# Patient Record
Sex: Male | Born: 1965 | Race: White | Hispanic: No | Marital: Married | Smoking: Never smoker
Health system: Southern US, Community
[De-identification: ages and names within clinical notes are randomized; demographics above are authoritative.]

## PROBLEM LIST (undated history)

## (undated) DIAGNOSIS — C4490 Unspecified malignant neoplasm of skin, unspecified: Secondary | ICD-10-CM

## (undated) DIAGNOSIS — G4730 Sleep apnea, unspecified: Secondary | ICD-10-CM

## (undated) DIAGNOSIS — E119 Type 2 diabetes mellitus without complications: Secondary | ICD-10-CM

## (undated) HISTORY — PX: NASAL SINUS SURGERY: SHX719

---

## 2012-11-06 ENCOUNTER — Emergency Department: Payer: Self-pay | Admitting: Emergency Medicine

## 2012-11-06 LAB — BASIC METABOLIC PANEL
Anion Gap: 5 — ABNORMAL LOW (ref 7–16)
Calcium, Total: 8.9 mg/dL (ref 8.5–10.1)
Chloride: 107 mmol/L (ref 98–107)
Co2: 27 mmol/L (ref 21–32)
EGFR (African American): >60
EGFR (Non-African Amer.): >60
Osmolality: 279 (ref 275–301)
Potassium: 4.1 mmol/L (ref 3.5–5.1)

## 2012-11-06 LAB — CBC
HCT: 47.2 % (ref 40.0–52.0)
HGB: 16.3 g/dL (ref 13.0–18.0)
MCH: 30.5 pg (ref 26.0–34.0)
MCHC: 34.5 g/dL (ref 32.0–36.0)
MCV: 89 fL (ref 80–100)
RDW: 13.9 % (ref 11.5–14.5)
WBC: 8.9 10*3/uL (ref 3.8–10.6)

## 2012-11-06 LAB — TROPONIN I
Troponin-I: <0.02 ng/mL
Troponin-I: <0.02 ng/mL

## 2012-11-25 ENCOUNTER — Emergency Department: Payer: Self-pay | Admitting: Emergency Medicine

## 2012-11-25 LAB — CK TOTAL AND CKMB (NOT AT ARMC)
CK, Total: 240 U/L — ABNORMAL HIGH (ref 35–232)
CK-MB: 2.8 ng/mL (ref 0.5–3.6)

## 2012-11-25 LAB — CBC
HCT: 47 % (ref 40.0–52.0)
MCH: 30.4 pg (ref 26.0–34.0)
MCHC: 34.4 g/dL (ref 32.0–36.0)
MCV: 88 fL (ref 80–100)
Platelet: 193 10*3/uL (ref 150–440)
RBC: 5.31 10*6/uL (ref 4.40–5.90)
RDW: 13.8 % (ref 11.5–14.5)
WBC: 13.1 10*3/uL — ABNORMAL HIGH (ref 3.8–10.6)

## 2012-11-25 LAB — COMPREHENSIVE METABOLIC PANEL
Alkaline Phosphatase: 75 U/L (ref 50–136)
Anion Gap: 3 — ABNORMAL LOW (ref 7–16)
BUN: 15 mg/dL (ref 7–18)
Bilirubin,Total: 0.5 mg/dL (ref 0.2–1.0)
Calcium, Total: 8.3 mg/dL — ABNORMAL LOW (ref 8.5–10.1)
EGFR (African American): >60
Osmolality: 272 (ref 275–301)
Potassium: 4.2 mmol/L (ref 3.5–5.1)
SGOT(AST): 32 U/L (ref 15–37)
Sodium: 135 mmol/L — ABNORMAL LOW (ref 136–145)

## 2012-11-25 LAB — TROPONIN I
Troponin-I: <0.02 ng/mL
Troponin-I: <0.02 ng/mL

## 2015-04-20 ENCOUNTER — Ambulatory Visit
Admission: EM | Admit: 2015-04-20 | Discharge: 2015-04-20 | Disposition: A | Discharge status: Home / Self Care | Payer: Self-pay | Attending: Emergency Medicine | Admitting: Emergency Medicine

## 2015-04-20 ENCOUNTER — Encounter: Payer: Self-pay | Admitting: Emergency Medicine

## 2015-04-20 DIAGNOSIS — H6123 Impacted cerumen, bilateral: Secondary | ICD-10-CM

## 2015-04-20 DIAGNOSIS — H6092 Unspecified otitis externa, left ear: Secondary | ICD-10-CM

## 2015-04-20 HISTORY — DX: Type 2 diabetes mellitus without complications: E11.9

## 2015-04-20 HISTORY — DX: Sleep apnea, unspecified: G47.30

## 2015-04-20 MED ORDER — CIPROFLOXACIN-DEXAMETHASONE 0.3-0.1 % OT SUSP: 4.0000 [drp] | Freq: Two times a day (BID) | OTIC | Status: AC

## 2015-04-20 NOTE — ED Notes (Signed)
Patient c/o pain in his left ear for 3 weeks.  Patient reports some drainage from his left ear.

## 2015-04-20 NOTE — Discharge Instructions (Signed)
Use medication as prescribed. Rest. Drink plenty of fluids. Keep ear dry.  Follow up with your primary care physician this week. Follow up with Ear, Nose and throat as needed.   Return to Urgent care for new or worsening concerns.    Otitis Externa Otitis externa is a bacterial or fungal infection of the outer ear canal. This is the area from the eardrum to the outside of the ear. Otitis externa is sometimes called "swimmer's ear." CAUSES  Possible causes of infection include:  Swimming in dirty water.  Moisture remaining in the ear after swimming or bathing.  Mild injury (trauma) to the ear.  Objects stuck in the ear (foreign body).  Cuts or scrapes (abrasions) on the outside of the ear. SIGNS AND SYMPTOMS  The first symptom of infection is often itching in the ear canal. Later signs and symptoms may include swelling and redness of the ear canal, ear pain, and yellowish-white fluid (pus) coming from the ear. The ear pain may be worse when pulling on the earlobe. DIAGNOSIS  Your health care provider will perform a physical exam. A sample of fluid may be taken from the ear and examined for bacteria or fungi. TREATMENT  Antibiotic ear drops are often given for 10 to 14 days. Treatment may also include pain medicine or corticosteroids to reduce itching and swelling. HOME CARE INSTRUCTIONS   Apply antibiotic ear drops to the ear canal as prescribed by your health care provider.  Take medicines only as directed by your health care provider.  If you have diabetes, follow any additional treatment instructions from your health care provider.  Keep all follow-up visits as directed by your health care provider. PREVENTION   Keep your ear dry. Use the corner of a towel to absorb water out of the ear canal after swimming or bathing.  Avoid scratching or putting objects inside your ear. This can damage the ear canal or remove the protective wax that lines the canal. This makes it easier  for bacteria and fungi to grow.  Avoid swimming in lakes, polluted water, or poorly chlorinated pools.  You may use ear drops made of rubbing alcohol and vinegar after swimming. Combine equal parts of white vinegar and alcohol in a bottle. Put 3 or 4 drops into each ear after swimming. SEEK MEDICAL CARE IF:   You have a fever.  Your ear is still red, swollen, painful, or draining pus after 3 days.  Your redness, swelling, or pain gets worse.  You have a severe headache.  You have redness, swelling, pain, or tenderness in the area behind your ear. MAKE SURE YOU:   Understand these instructions.  Will watch your condition.  Will get help right away if you are not doing well or get worse.   This information is not intended to replace advice given to you by your health care provider. Make sure you discuss any questions you have with your health care provider.   Document Released: 01/14/2005 Document Revised: 02/04/2014 Document Reviewed: 01/31/2011 Elsevier Interactive Patient Education 2016 [employer] The structures of the external ear canal secrete a waxy substance known as cerumen. Excess cerumen can build up in the ear canal, causing a condition known as cerumen impaction. Cerumen impaction can cause ear pain and disrupt the function of the ear. The rate of cerumen production differs for each individual. In certain individuals, the configuration of the ear canal may decrease his or her ability to naturally remove cerumen. CAUSES Cerumen  impaction is caused by excessive cerumen production or buildup. RISK FACTORS  Frequent use of swabs to clean ears.  Having narrow ear canals.  Having eczema.  Being dehydrated. SIGNS AND SYMPTOMS  Diminished hearing.  Ear drainage.  Ear pain.  Ear itch. TREATMENT Treatment may involve:  Over-the-counter or prescription ear drops to soften the cerumen.  Removal of cerumen by a health care provider. This  may be done with:  Irrigation with warm water. This is the most common method of removal.  Ear curettes and other instruments.  Surgery. This may be done in severe cases. HOME CARE INSTRUCTIONS  Take medicines only as directed by your health care provider.  Do not insert objects into the ear with the intent of cleaning the ear. PREVENTION  Do not insert objects into the ear, even with the intent of cleaning the ear. Removing cerumen as a part of normal hygiene is not necessary, and the use of swabs in the ear canal is not recommended.  Drink enough water to keep your urine clear or pale yellow.  Control your eczema if you have it. SEEK MEDICAL CARE IF:  You develop ear pain.  You develop bleeding from the ear.  The cerumen does not clear after you use ear drops as directed.   This information is not intended to replace advice given to you by your health care provider. Make sure you discuss any questions you have with your health care provider.   Document Released: 02/22/2004 Document Revised: 02/04/2014 Document Reviewed: 08/31/2014 Elsevier Interactive Patient Education [employer].

## 2015-04-20 NOTE — ED Provider Notes (Signed)
[facility]  ____________________________________________  Time seen: Approximately 12:16 PM  I have reviewed the triage vital signs and the nursing notes.   HISTORY  Chief Complaint Otalgia   HPI Alvin Ward is a 50 y.o. male presents for complaints of 3 weeks of left ear fullness, states x 2 weeks with some occasional left ear pain. Patient reports that he does have a history of bilateral cerumen impaction and states that this does feel similar. Patient reports that he has been trying over-the-counter wax removal kits and has received some results state that he has gotten some of the wax out. Denies fevers. States left ear discomfort has been mild and not persistent.  Denies hearing loss, ringing in ears, swelling around ear. Denies recent sickness, runny nose, nasal congestion, sore throat, fevers, neck pain. Denies chest pain, shortness of breath, chest pain with deep breath, dizziness, weakness, hearing changes, abdominal pain, dysuria, neck pain, back pain, extremity pain or extremity swelling.  Denies recent sickness.   Past Medical History  Diagnosis Date  . Diabetes mellitus without complication ([place])   . Sleep apnea     There are no active problems to display for this patient.   Past Surgical History  Procedure Laterality Date  . Nasal sinus surgery      Current Outpatient Rx  Name  Route  Sig  Dispense  Refill  . metFORMIN (GLUCOPHAGE) 500 MG tablet   Oral   Take 500 mg by mouth 2 (two) times daily with a meal.           Allergies Ace inhibitors; Banana; and Latex  History reviewed. No pertinent family history.  Social History Social History  Substance Use Topics  . Smoking status: Never Smoker   . Smokeless tobacco: None  . Alcohol Use: No    Review of Systems Constitutional: No fever/chills Eyes: No visual changes. ENT: No sore throat.Left ear discomfort. Cardiovascular: Denies chest pain. Respiratory: Denies shortness of  breath. Gastrointestinal: No abdominal pain.  No nausea, no vomiting.  No diarrhea.  No constipation. Genitourinary: Negative for dysuria. Musculoskeletal: Negative for back pain. Skin: Negative for rash. Neurological: Negative for headaches, focal weakness or numbness.  10-point ROS otherwise negative.  ____________________________________________   PHYSICAL EXAM:  VITAL SIGNS: ED Triage Vitals  Enc Vitals Group     BP 04/20/15 1127 134/93 mmHg     Pulse Rate 04/20/15 1127 77     Resp 04/20/15 1127 16     Temp 04/20/15 1127 98.1 F (36.7 C)     Temp Source 04/20/15 1127 Oral     SpO2 04/20/15 1127 97 %     Weight 04/20/15 1127 320 lb (145.151 kg)     Height 04/20/15 1127 5\' 11"  (1.803 m)     Head Cir --      Peak Flow --      Pain Score 04/20/15 1131 5     Pain Loc --      Pain Edu? --      Excl. in [place]? --     Constitutional: Alert and oriented. Well appearing and in no acute distress. Eyes: Conjunctivae are normal. PERRL. EOMI. Head: Atraumatic. No sinus tenderness to palpation. No swelling. No erythema.  Ears: Right: Mild cerumen impaction noted, cerumen removed with curette, post cerumen impaction reexamined, normal TM, no exudate or drainage, nontender. Left: Mild whitish brownish exudate in the ear canal, mild canal swelling and inflammation, unable to initially visualize TM. Curette gently used to remove some  exudate in canal, patient tolerated well, post removal reexamined, nontender with movement, TM non-erythematous,TM appears intact. No surrounding erythema, edema or rash bilaterally. Hearing grossly intact bilaterally.  Nose: No congestion/rhinnorhea.  Mouth/Throat: Mucous membranes are moist.  Oropharynx non-erythematous. No tonsillar swelling or exudate. Neck: No stridor.  No cervical spine tenderness to palpation. Hematological/Lymphatic/Immunilogical: No cervical lymphadenopathy. Cardiovascular: Normal rate, regular rhythm. Grossly normal heart sounds.  Good  peripheral circulation. Respiratory: Normal respiratory effort.  No retractions. Lungs CTAB. Gastrointestinal: Soft and nontender. Obese abdomen. Musculoskeletal: No lower or upper extremity tenderness nor edema. No cervical, thoracic or lumbar tenderness to palpation. Neurologic:  Normal speech and language. No gross focal neurologic deficits are appreciated. No gait instability. Skin:  Skin is warm, dry and intact. No rash noted. Psychiatric: Mood and affect are normal. Speech and behavior are normal.  ____________________________________________   LABS (all labs ordered are listed, but only abnormal results are displayed)  Labs Reviewed - No data to display ____________________________________________  PROCEDURES  Procedure(s) performed:  Procedure explained and verbal consent obtained. Ceruminosis is noted.  Wax is removed by curette with manual debridement. Patient tolerated well.  ____________________________________________   INITIAL IMPRESSION / ASSESSMENT AND PLAN / ED COURSE  Pertinent labs & imaging results that were available during my care of the patient were reviewed by me and considered in my medical decision making (see chart for details).  Very well-appearing patient. No acute distress. Presents for the complaints of left ear discomfort intermittently over the last 2-3 weeks. Patient does reports history of cerumen impaction in the past and which felt similar. Denies any hearing deficits. Lungs clear throughout. Abdomen soft and nontender. Moist mucous membranes. Denies recent sickness. Right cerumen impaction removed by curette, patient reports improved hearing post removal. Patient also with cerumen and exudate and left ear canal removed with curette, patient also reports improved hearing post removal from left ear as well as improved discomfort. Patient reports feeling better. Will treat left otitis externa with Ciprodex. Encouraged keeping dry and closing monitoring  symptoms. Encouraged patient to follow-up with PCP this week or ENT as needed.  Discussed follow up with Primary care physician this week. Discussed follow up and return parameters including no resolution or any worsening concerns. Patient verbalized understanding and agreed to plan.   ____________________________________________   FINAL CLINICAL IMPRESSION(S) / ED DIAGNOSES  Final diagnoses:  Cerumen impaction, bilateral  Otitis externa, left      Note: This dictation was prepared with Dragon dictation along with smaller phrase technology. Any transcriptional errors that result from this process are unintentional.    Marylene Land, NP 04/20/15 1325

## 2016-10-09 ENCOUNTER — Encounter: Payer: Self-pay | Admitting: Emergency Medicine

## 2016-10-09 ENCOUNTER — Emergency Department: Payer: Self-pay

## 2016-10-09 ENCOUNTER — Other Ambulatory Visit: Payer: Self-pay

## 2016-10-09 ENCOUNTER — Emergency Department
Admission: EM | Admit: 2016-10-09 | Discharge: 2016-10-09 | Disposition: A | Discharge status: Home / Self Care | Payer: Self-pay | Attending: Emergency Medicine | Admitting: Emergency Medicine

## 2016-10-09 DIAGNOSIS — K219 Gastro-esophageal reflux disease without esophagitis: Secondary | ICD-10-CM | POA: Insufficient documentation

## 2016-10-09 DIAGNOSIS — E119 Type 2 diabetes mellitus without complications: Secondary | ICD-10-CM | POA: Insufficient documentation

## 2016-10-09 DIAGNOSIS — Z7984 Long term (current) use of oral hypoglycemic drugs: Secondary | ICD-10-CM | POA: Insufficient documentation

## 2016-10-09 DIAGNOSIS — Z91040 Latex allergy status: Secondary | ICD-10-CM | POA: Insufficient documentation

## 2016-10-09 LAB — CBC
HEMATOCRIT: 47.1 % (ref 40.0–52.0)
HEMOGLOBIN: 16.4 g/dL (ref 13.0–18.0)
MCH: 29.9 pg (ref 26.0–34.0)
MCHC: 34.7 g/dL (ref 32.0–36.0)
MCV: 86 fL (ref 80.0–100.0)
Platelets: 221 10*3/uL (ref 150–440)
RBC: 5.48 MIL/uL (ref 4.40–5.90)
RDW: 13.9 % (ref 11.5–14.5)
WBC: 9.2 10*3/uL (ref 3.8–10.6)

## 2016-10-09 LAB — TROPONIN I: Troponin I: <0.03 ng/mL (ref <0.03)

## 2016-10-09 LAB — BASIC METABOLIC PANEL
ANION GAP: 7 (ref 5–15)
BUN: 15 mg/dL (ref 6–20)
CHLORIDE: 100 mmol/L — AB (ref 101–111)
CO2: 28 mmol/L (ref 22–32)
Calcium: 8.5 mg/dL — ABNORMAL LOW (ref 8.9–10.3)
Creatinine, Ser: 1.09 mg/dL (ref 0.61–1.24)
GFR calc Af Amer: >60 mL/min (ref >60)
GLUCOSE: 224 mg/dL — AB (ref 65–99)
POTASSIUM: 4.3 mmol/L (ref 3.5–5.1)
SODIUM: 135 mmol/L (ref 135–145)

## 2016-10-09 MED ORDER — GI COCKTAIL ~~LOC~~
30.0000 mL | Freq: Once | ORAL | Status: AC
  Administered 2016-10-09: 30 mL via ORAL
  Filled 2016-10-09: qty 30

## 2016-10-09 MED ORDER — FAMOTIDINE 20 MG PO TABS
40.0000 mg | ORAL_TABLET | Freq: Once | ORAL | Status: AC
  Administered 2016-10-09: 40 mg via ORAL
  Filled 2016-10-09: qty 2

## 2016-10-09 MED ORDER — FAMOTIDINE 20 MG PO TABS: 20.0000 mg | ORAL_TABLET | Freq: Two times a day (BID) | ORAL | 0 refills | Status: AC

## 2016-10-09 NOTE — ED Triage Notes (Signed)
Pt c/o sudden onset of left sided chest pain that radiates into left arm causing numbness and tingling to fingertips. Pt reports pain woke him up this AM and has subsided. Pt denies N/V and SOB.

## 2016-10-09 NOTE — Discharge Instructions (Signed)
Fortunately today your blood work, EKG, and your chest x-ray were all reassuring. I very highly suspect that you're having acid reflux. In addition to initiating Pepcid twice a day for the next month, it is critically important that you make sure you avoid fried foods, avoid spicy foods, and sit up or stand up for at least 2 hours after your last meal of the day. You also need to begin losing weight as this will dramatically help your symptoms.  It was a pleasure to take care of you today, and thank you for coming to our emergency department.  If you have any questions or concerns before leaving please ask the nurse to grab me and I'm more than happy to go through your aftercare instructions again.  If you were prescribed any opioid pain medication today such as Norco, Vicodin, Percocet, morphine, hydrocodone, or oxycodone please make sure you do not drive when you are taking this medication as it can alter your ability to drive safely.  If you have any concerns once you are home that you are not improving or are in fact getting worse before you can make it to your follow-up appointment, please do not hesitate to call 911 and come back for further evaluation.  Darel Hong, MD  Results for orders placed or performed during the hospital encounter of 16/10/96  Basic metabolic panel  Result Value Ref Range   Sodium 135 135 - 145 mmol/L   Potassium 4.3 3.5 - 5.1 mmol/L   Chloride 100 (L) 101 - 111 mmol/L   CO2 28 22 - 32 mmol/L   Glucose, Bld 224 (H) 65 - 99 mg/dL   BUN 15 6 - 20 mg/dL   Creatinine, Ser 1.09 0.61 - 1.24 mg/dL   Calcium 8.5 (L) 8.9 - 10.3 mg/dL   GFR calc non Af Amer >60 >60 mL/min   GFR calc Af Amer >60 >60 mL/min   Anion gap 7 5 - 15  CBC  Result Value Ref Range   WBC 9.2 3.8 - 10.6 K/uL   RBC 5.48 4.40 - 5.90 MIL/uL   Hemoglobin 16.4 13.0 - 18.0 g/dL   HCT 47.1 40.0 - 52.0 %   MCV 86.0 80.0 - 100.0 fL   MCH 29.9 26.0 - 34.0 pg   MCHC 34.7 32.0 - 36.0 g/dL   RDW 13.9  11.5 - 14.5 %   Platelets 221 150 - 440 K/uL  Troponin I  Result Value Ref Range   Troponin I <0.03 <0.03 ng/mL   Dg Chest 2 View  Result Date: 10/09/2016 CLINICAL DATA:  Sudden onset of left-sided chest pain. EXAM: CHEST  2 VIEW COMPARISON:  Radiograph 11/25/2012 FINDINGS: The cardiomediastinal contours are normal. The lungs are clear. Pulmonary vasculature is normal. No consolidation, pleural effusion, or pneumothorax. No acute osseous abnormalities are seen. IMPRESSION: No acute abnormality. Electronically Signed   By: Jeb Levering M.D.   On: 10/09/2016 05:02

## 2016-10-09 NOTE — ED Provider Notes (Signed)
[facility] Emergency Department Provider Note  ____________________________________________   First MD Initiated Contact with Patient 10/09/16 [phone]     (approximate)  I have reviewed the triage vital signs and the nursing notes.   HISTORY   Chief Complaint Chest Pain    HPI Alvin Ward is a 51 y.o. male who self presents to the emergency department with several hours of lower chest/upper abdominal burning discomfort. The pain woke him from his sleep roughly around 2:30 in the morning. It is associated with a dry cough. No foul taste in his mouth. It is nonexertional. It is constant. It is associated with nausea but no vomiting. He has a past medical history of diabetes and morbid obesity. He's had no history of deep vein thrombosis or pulmonary embolism. He's had no leg swelling.Last night he had a cheeseburger shortly before going to bed. He does not drink alcohol. The patient actually initially awoke from sleep with numbness and tingling in his left arm although he says that he frequently has these symptoms on both his right and his left arm when lying on the sides.   Past Medical History:  Diagnosis Date  . Diabetes mellitus without complication ([place])   . Sleep apnea     There are no active problems to display for this patient.   Past Surgical History:  Procedure Laterality Date  . NASAL SINUS SURGERY      Prior to Admission medications   Medication Sig Start Date End Date Taking? Authorizing Provider  famotidine (PEPCID) 20 MG tablet Take 1 tablet (20 mg total) by mouth 2 (two) times daily. 10/09/16 10/09/17  Darel Hong, MD  metFORMIN (GLUCOPHAGE) 500 MG tablet Take 500 mg by mouth 2 (two) times daily with a meal.    [provider]    Allergies Ace inhibitors; Banana; and Latex  History reviewed. No pertinent family history.  Social History Social History  Substance Use Topics  . Smoking status: Never Smoker  .  Smokeless tobacco: Never Used  . Alcohol use No    Review of Systems Constitutional: No fever/chills Eyes: No visual changes. ENT: No sore throat. Cardiovascular: Positive chest pain. Respiratory: Denies shortness of breath. Gastrointestinal: Positive abdominal pain.  Positive nausea, no vomiting.  No diarrhea.  No constipation. Genitourinary: Negative for dysuria. Musculoskeletal: Negative for back pain. Skin: Negative for rash. Neurological: Negative for headaches, focal weakness or numbness.   ____________________________________________   PHYSICAL EXAM:  VITAL SIGNS: ED Triage Vitals  Enc Vitals Group     BP 10/09/16 0443 (!) 149/86     Pulse Rate 10/09/16 0443 84     Resp 10/09/16 0443 17     Temp 10/09/16 0443 97.6 F (36.4 C)     Temp Source 10/09/16 0443 Oral     SpO2 10/09/16 0443 96 %     Weight 10/09/16 0440 (!) 320 lb (145.2 kg)     Height --      Head Circumference --      Peak Flow --      Pain Score --      Pain Loc --      Pain Edu? --      Excl. in [place]? --     Constitutional: Alert and oriented 4 well appearing nontoxic no diaphoresis speaks in full clear sentences Eyes: PERRL EOMI. Head: Atraumatic. Nose: No congestion/rhinnorhea. Mouth/Throat: No trismus Neck: No stridor.   Cardiovascular: Normal rate, regular rhythm. Grossly normal heart sounds.  Good peripheral  circulation. Respiratory: Normal respiratory effort.  No retractions. Lungs CTAB and moving good air Gastrointestinal: Morbidly obese soft nontender Musculoskeletal: No lower extremity edema   Neurologic:  Normal speech and language. No gross focal neurologic deficits are appreciated. Skin:  Skin is warm, dry and intact. No rash noted. Psychiatric: Mood and affect are normal. Speech and behavior are normal.    ____________________________________________   DIFFERENTIAL includes but not limited to  Gastric reflux, esophagitis, pulmonary embolism, acute coronary syndrome,  pneumothorax ____________________________________________   LABS (all labs ordered are listed, but only abnormal results are displayed)  Labs Reviewed  BASIC METABOLIC PANEL - Abnormal; Notable for the following:       Result Value   Chloride 100 (*)    Glucose, Bld 224 (*)    Calcium 8.5 (*)    All other components within normal limits  CBC  TROPONIN I    No signs of acute ischemia elevated glucose although no signs of diabetic ketoacidosis __________________________________________  EKG  ED ECG REPORT I, Darel Hong, the attending physician, personally viewed and interpreted this ECG.  Date: 10/09/2016 Rate: 88 Rhythm: normal sinus rhythm QRS Axis: normal Intervals: normal ST/T Wave abnormalities: normal Narrative Interpretation: no evidence of acute ischemia  ____________________________________________  RADIOLOGY  Chest x-ray with no acute disease noted ____________________________________________   PROCEDURES  Procedure(s) performed: o  Procedures  Critical Care performed: no  Observation: no ____________________________________________   INITIAL IMPRESSION / ASSESSMENT AND PLAN / ED COURSE  Pertinent labs & imaging results that were available during my care of the patient were reviewed by me and considered in my medical decision making (see chart for details).  The patient arrives hemodynamically stable and well-appearing with a normal EKG and chest x-ray. His history is very atypical and not consistent with acute coronary syndrome. His given a GI cocktail and 40 mg of famotidine which nearly completely resolved his symptoms. I've encouraged the patient to begin losing weight and to take an H2 blocker for a full month. He is discharged home in improved condition.      ____________________________________________   FINAL CLINICAL IMPRESSION(S) / ED DIAGNOSES  Final diagnoses:  Gastric reflux  Morbid obesity ([place])      NEW MEDICATIONS  STARTED DURING THIS VISIT:  Discharge Medication List as of 10/09/2016  6:26 AM    START taking these medications   Details  famotidine (PEPCID) 20 MG tablet Take 1 tablet (20 mg total) by mouth 2 (two) times daily., Starting Wed 10/09/2016, Until Thu 10/09/2017, Print         Note:  This document was prepared using Dragon voice recognition software and may include unintentional dictation errors.     Darel Hong, MD 10/09/16 1554

## 2017-03-04 ENCOUNTER — Other Ambulatory Visit: Payer: Self-pay

## 2017-03-04 ENCOUNTER — Ambulatory Visit
Admission: EM | Admit: 2017-03-04 | Discharge: 2017-03-04 | Disposition: A | Discharge status: Home / Self Care | Payer: Self-pay | Attending: Family Medicine | Admitting: Family Medicine

## 2017-03-04 ENCOUNTER — Encounter: Payer: Self-pay | Admitting: *Deleted

## 2017-03-04 DIAGNOSIS — R059 Cough, unspecified: Secondary | ICD-10-CM

## 2017-03-04 DIAGNOSIS — R05 Cough: Secondary | ICD-10-CM

## 2017-03-04 MED ORDER — HYDROCODONE-HOMATROPINE 5-1.5 MG/5ML PO SYRP: 5.0000 mL | ORAL_SOLUTION | Freq: Four times a day (QID) | ORAL | 0 refills | Status: DC | PRN

## 2017-03-04 MED ORDER — AZITHROMYCIN 250 MG PO TABS: ORAL_TABLET | ORAL | 0 refills | Status: DC

## 2017-03-04 NOTE — Discharge Instructions (Signed)
This is likely bronchitis.   Medications as prescribed.  Take care  Dr. Lacinda Axon

## 2017-03-04 NOTE — ED Provider Notes (Signed)
[facility]    CSN: [id number] Arrival date & time: 03/04/17  7628  History   Chief Complaint Chief Complaint  Patient presents with  . Cough   HPI  52 year old male presents with cough.  States that he has not been feeling we since Saturday.  He has had chest congestion, chest tightness, cough.  He states that his cough is not particularly productive.  No fever that he knows of but he has had felt febrile and is also had chills.  He reports body aches as well.  No reported sick contacts.  No known exacerbating or relieving factors.  No reports of shortness of breath.  He states that his cough is worse at night.  No other associated symptoms.  No other complaints.  Past Medical History:  Diagnosis Date  . Diabetes mellitus without complication ([place])   . Sleep apnea    Past Surgical History:  Procedure Laterality Date  . NASAL SINUS SURGERY     Home Medications    Prior to Admission medications   Medication Sig Start Date End Date Taking? Authorizing Provider  metFORMIN (GLUCOPHAGE) 500 MG tablet Take 500 mg by mouth 2 (two) times daily with a meal.   Yes [provider]  azithromycin (ZITHROMAX) 250 MG tablet 2 tablets on day 1, then 1 tablet daily on days 2-5. 03/04/17   Coral Spikes, DO  famotidine (PEPCID) 20 MG tablet Take 1 tablet (20 mg total) by mouth 2 (two) times daily. 10/09/16 10/09/17  Darel Hong, MD  HYDROcodone-homatropine (HYCODAN) 5-1.5 MG/5ML syrup Take 5 mLs by mouth every 6 (six) hours as needed. 03/04/17   Coral Spikes, DO   Family History Family History  Problem Relation Age of Onset  . Diabetes Mother   . Diabetes Father    Social History Social History   Tobacco Use  . Smoking status: Never Smoker  . Smokeless tobacco: Never Used  Substance Use Topics  . Alcohol use: No  . Drug use: No    Allergies   Ace inhibitors; Banana; and Latex   Review of Systems Review of Systems  Constitutional: Positive for chills.    Respiratory: Positive for cough and chest tightness.   Musculoskeletal:       Body aches.   Physical Exam Triage Vital Signs ED Triage Vitals  Enc Vitals Group     BP 03/04/17 0845 (!) 144/90     Pulse Rate 03/04/17 0845 95     Resp 03/04/17 0845 16     Temp 03/04/17 0845 99.1 F (37.3 C)     Temp Source 03/04/17 0845 Oral     SpO2 03/04/17 0845 95 %     Weight 03/04/17 0846 (!) 350 lb (158.8 kg)     Height 03/04/17 0846 5\' 11"  (1.803 m)     Head Circumference --      Peak Flow --      Pain Score 03/04/17 0845 0     Pain Loc --      Pain Edu? --      Excl. in [place]? --    Updated Vital Signs BP (!) 144/90 (BP Location: Left Arm)   Pulse 95   Temp 99.1 F (37.3 C) (Oral)   Resp 16   Ht 5\' 11"  (1.803 m)   Wt (!) 350 lb (158.8 kg)   SpO2 95%   BMI 48.82 kg/m     Physical Exam  Constitutional: He is oriented to person, place, and time.  He appears well-developed and well-nourished. No distress.  HENT:  Head: Normocephalic and atraumatic.  Mouth/Throat: Oropharynx is clear and moist.  Eyes: Conjunctivae are normal. Right eye exhibits no discharge. Left eye exhibits no discharge.  Cardiovascular: Normal rate and regular rhythm.  No murmur heard. Pulmonary/Chest: Effort normal and breath sounds normal. No respiratory distress. He has no wheezes. He has no rales.  Neurological: He is alert and oriented to person, place, and time.  Psychiatric: He has a normal mood and affect. His behavior is normal.  Nursing note and vitals reviewed.  UC Treatments / Results  Labs (all labs ordered are listed, but only abnormal results are displayed) Labs Reviewed - No data to display  EKG  EKG Interpretation None       Radiology No results found.  Procedures Procedures (including critical care time)  Medications Ordered in UC Medications - No data to display   Initial Impression / Assessment and Plan / UC Course  I have reviewed the triage vital signs and the nursing  notes.  Pertinent labs & imaging results that were available during my care of the patient were reviewed by me and considered in my medical decision making (see chart for details).     52 year old male presents with cough.  Suspect the beginnings of bronchitis.  Patient has no insurance.  Exam unremarkable and unrevealing.  However, his pulse ox is 95% on room air.  This is atypical.  Given her clinical picture and all of the above, I have elected to treat him with azithromycin. Hycodan for cough.  Final Clinical Impressions(s) / UC Diagnoses   Final diagnoses:  Cough    ED Discharge Orders        Ordered    azithromycin (ZITHROMAX) 250 MG tablet     03/04/17 0932    HYDROcodone-homatropine (HYCODAN) 5-1.5 MG/5ML syrup  Every 6 hours PRN     03/04/17 0932     Controlled Substance Prescriptions [place] Controlled Substance Registry consulted? Not Applicable   Coral Spikes, DO 03/04/17 6270

## 2017-03-04 NOTE — ED Triage Notes (Signed)
Patient started having chest congestion, cough and body aches 3 days ago.

## 2017-03-06 ENCOUNTER — Ambulatory Visit
Admission: EM | Admit: 2017-03-06 | Discharge: 2017-03-06 | Disposition: A | Discharge status: Home / Self Care | Payer: Self-pay | Attending: Emergency Medicine | Admitting: Emergency Medicine

## 2017-03-06 ENCOUNTER — Encounter: Payer: Self-pay | Admitting: *Deleted

## 2017-03-06 ENCOUNTER — Ambulatory Visit (INDEPENDENT_AMBULATORY_CARE_PROVIDER_SITE_OTHER): Payer: Self-pay

## 2017-03-06 ENCOUNTER — Other Ambulatory Visit: Payer: Self-pay

## 2017-03-06 DIAGNOSIS — R05 Cough: Secondary | ICD-10-CM

## 2017-03-06 DIAGNOSIS — R059 Cough, unspecified: Secondary | ICD-10-CM | POA: Diagnosis present

## 2017-03-06 NOTE — Discharge Instructions (Addendum)
Cxr was normal. Please finish abx as directed, drink plenty of water. Follow up with PCP next week if symptoms persist. If worsening symptoms (shortness of breath, chest pain, etc) go to ER. Return to UC as needed.

## 2017-03-06 NOTE — ED Provider Notes (Addendum)
[facility]    CSN: [id number] Arrival date & time: 03/06/17  0849     History   Chief Complaint Chief Complaint  Patient presents with  . Cough  . Nasal Congestion    HPI Alvin Ward is a 52 y.o. male.   52 yr old morbidly obese male pt presents to UC with cc of continued cough seen 2/5 here for same, taking zpak and hycodan with little relief. Pt concerned not well, here for re-evaluation. Hx of sleep apnea.   The history is provided by the patient and the spouse. No language interpreter was used.  Cough  Cough characteristics:  Non-productive Sputum characteristics:  Nondescript Severity:  Moderate Onset quality:  Gradual Timing:  Constant Progression:  Unchanged Chronicity:  New Smoker: no   Context: upper respiratory infection   Relieved by:  Nothing Worsened by:  Lying down Ineffective treatments:  Rest (hycodan) Associated symptoms: no chills, no fever, no headaches, no rash, no shortness of breath and no sore throat   Associated symptoms comment:  Cough Risk factors: recent infection     Past Medical History:  Diagnosis Date  . Diabetes mellitus without complication ([place])   . Sleep apnea     Patient Active Problem List   Diagnosis Date Noted  . Cough 03/06/2017    Past Surgical History:  Procedure Laterality Date  . NASAL SINUS SURGERY         Home Medications    Prior to Admission medications   Medication Sig Start Date End Date Taking? Authorizing Provider  azithromycin (ZITHROMAX) 250 MG tablet 2 tablets on day 1, then 1 tablet daily on days 2-5. 03/04/17  Yes Cook, Jayce G, DO  famotidine (PEPCID) 20 MG tablet Take 1 tablet (20 mg total) by mouth 2 (two) times daily. 10/09/16 10/09/17 Yes Darel Hong, MD  HYDROcodone-homatropine [facility]) 5-1.5 MG/5ML syrup Take 5 mLs by mouth every 6 (six) hours as needed. 03/04/17  Yes Cook, Jayce G, DO  metFORMIN (GLUCOPHAGE) 500 MG tablet Take 500 mg by mouth 2 (two) times daily with a  meal.   Yes [provider]    Family History Family History  Problem Relation Age of Onset  . Diabetes Mother   . Diabetes Father     Social History Social History   Tobacco Use  . Smoking status: Never Smoker  . Smokeless tobacco: Never Used  Substance Use Topics  . Alcohol use: No  . Drug use: No     Allergies   Ace inhibitors; Banana; and Latex   Review of Systems Review of Systems  Constitutional: Negative for chills and fever.  HENT: Positive for congestion and postnasal drip. Negative for sore throat.   Eyes: Negative.   Respiratory: Positive for cough. Negative for shortness of breath.   Gastrointestinal: Negative for abdominal pain, nausea and vomiting.  Endocrine: Negative.   Genitourinary: Negative for dysuria.  Musculoskeletal: Negative for back pain.  Skin: Negative for rash.  Allergic/Immunologic: Negative.   Neurological: Negative for headaches.  Hematological: Negative.   Psychiatric/Behavioral: Negative.   All other systems reviewed and are negative.    Physical Exam Triage Vital Signs ED Triage Vitals  Enc Vitals Group     BP 03/06/17 0932 (!) 148/91     Pulse Rate 03/06/17 0932 86     Resp 03/06/17 0932 16     Temp 03/06/17 0932 98.8 F (37.1 C)     Temp Source 03/06/17 0932 Oral     SpO2  03/06/17 0932 97 %     Weight --      Height --      Head Circumference --      Peak Flow --      Pain Score 03/06/17 0934 3     Pain Loc --      Pain Edu? --      Excl. in [place]? --    No data found.  Updated Vital Signs BP (!) 148/91 (BP Location: Left Arm)   Pulse 86   Temp 98.8 F (37.1 C) (Oral)   Resp 16   SpO2 97%   Visual Acuity Right Eye Distance:   Left Eye Distance:   Bilateral Distance:    Right Eye Near:   Left Eye Near:    Bilateral Near:     Physical Exam  Constitutional: He is oriented to person, place, and time. He appears well-developed and well-nourished. He is active and cooperative.  Non-toxic  appearance. He does not have a sickly appearance. He does not appear ill. No distress.  HENT:  Head: Normocephalic.  Right Ear: Tympanic membrane is retracted.  Left Ear: Tympanic membrane is retracted.  Nose: Mucosal edema present.  Mouth/Throat: Uvula is midline, oropharynx is clear and moist and mucous membranes are normal.  Eyes: Conjunctivae, EOM and lids are normal. Pupils are equal, round, and reactive to light.  Neck: Normal range of motion.  Cardiovascular: Normal rate, regular rhythm and normal heart sounds.  Pulmonary/Chest: Effort normal and breath sounds normal. No respiratory distress. He has no decreased breath sounds. He has no wheezes.  Abdominal: Soft. He exhibits no distension.  Musculoskeletal: Normal range of motion.  Neurological: He is alert and oriented to person, place, and time. No cranial nerve deficit or sensory deficit. GCS eye subscore is 4. GCS verbal subscore is 5. GCS motor subscore is 6.  Skin: Skin is warm, dry and intact. No rash noted.  Psychiatric: He has a normal mood and affect. His speech is normal and behavior is normal. Thought content normal.  Nursing note and vitals reviewed.    UC Treatments / Results  Labs (all labs ordered are listed, but only abnormal results are displayed) Labs Reviewed - No data to display  EKG  EKG Interpretation None       Radiology Dg Chest 2 View  Result Date: 03/06/2017 CLINICAL DATA:  Productive cough and chest tightness for the past 5 days. EXAM: CHEST  2 VIEW COMPARISON:  Chest x-ray dated October 09, 2016. FINDINGS: The heart size and mediastinal contours are within normal limits. Both lungs are clear. The visualized skeletal structures are unremarkable. IMPRESSION: No active cardiopulmonary disease. Electronically Signed   By: Titus Dubin M.D.   On: 03/06/2017 10:57    Procedures Procedures (including critical care time)  Medications Ordered in UC Medications - No data to display   Initial  Impression / Assessment and Plan / UC Course  I have reviewed the triage vital signs and the nursing notes.  Pertinent labs & imaging results that were available during my care of the patient were reviewed by me and considered in my medical decision making (see chart for details).  Discussed pt exam and plan of care with Dr. Lacinda Axon, agrees with plan of care. CXR obtained to r/o CHF or pneumonia. Pt agrees to CXR. Reviewed results and plan of care with Dr Lacinda Axon and pt.   Cxr was normal. Please finish abx as directed, drink plenty of water. Follow up with PCP  next week if symptoms persist. If worsening symptoms (shortness of breath, chest pain, etc) go to ER. Return to UC as needed. Pt verbalized understanding to this provider.   Final Clinical Impressions(s) / UC Diagnoses   Final diagnoses:  Cough    ED Discharge Orders    None       Controlled Substance Prescriptions    Jerris Keltz, Jeanett Schlein, NP 51/89/84 2103    Tori Milks, NP 12/81/18 2129

## 2017-03-06 NOTE — ED Triage Notes (Signed)
Patient's symptoms of cough and congestion have not improved since last visit on 03/04/17.

## 2018-01-10 ENCOUNTER — Other Ambulatory Visit: Payer: Self-pay

## 2018-01-10 ENCOUNTER — Emergency Department
Admission: EM | Admit: 2018-01-10 | Discharge: 2018-01-10 | Disposition: A | Discharge status: Home / Self Care | Payer: Self-pay | Attending: Emergency Medicine | Admitting: Emergency Medicine

## 2018-01-10 DIAGNOSIS — Z85828 Personal history of other malignant neoplasm of skin: Secondary | ICD-10-CM | POA: Insufficient documentation

## 2018-01-10 DIAGNOSIS — K645 Perianal venous thrombosis: Secondary | ICD-10-CM | POA: Insufficient documentation

## 2018-01-10 DIAGNOSIS — K649 Unspecified hemorrhoids: Secondary | ICD-10-CM | POA: Insufficient documentation

## 2018-01-10 DIAGNOSIS — Z91040 Latex allergy status: Secondary | ICD-10-CM | POA: Insufficient documentation

## 2018-01-10 DIAGNOSIS — E119 Type 2 diabetes mellitus without complications: Secondary | ICD-10-CM | POA: Insufficient documentation

## 2018-01-10 DIAGNOSIS — Z7984 Long term (current) use of oral hypoglycemic drugs: Secondary | ICD-10-CM | POA: Insufficient documentation

## 2018-01-10 HISTORY — DX: Unspecified malignant neoplasm of skin, unspecified: C44.90

## 2018-01-10 LAB — PROTIME-INR
INR: 1.09
Prothrombin Time: 14 seconds (ref 11.4–15.2)

## 2018-01-10 LAB — COMPREHENSIVE METABOLIC PANEL
ALT: 21 U/L (ref 0–44)
AST: 18 U/L (ref 15–41)
Albumin: 3.8 g/dL (ref 3.5–5.0)
Alkaline Phosphatase: 61 U/L (ref 38–126)
Anion gap: 5 (ref 5–15)
BUN: 18 mg/dL (ref 6–20)
CO2: 25 mmol/L (ref 22–32)
CREATININE: 1.32 mg/dL — AB (ref 0.61–1.24)
Calcium: 8.4 mg/dL — ABNORMAL LOW (ref 8.9–10.3)
Chloride: 103 mmol/L (ref 98–111)
GFR calc Af Amer: >60 mL/min (ref >60)
Glucose, Bld: 219 mg/dL — ABNORMAL HIGH (ref 70–99)
Potassium: 3.7 mmol/L (ref 3.5–5.1)
Sodium: 133 mmol/L — ABNORMAL LOW (ref 135–145)
Total Bilirubin: 1 mg/dL (ref 0.3–1.2)
Total Protein: 7.1 g/dL (ref 6.5–8.1)

## 2018-01-10 LAB — CBC
HCT: 47.4 % (ref 39.0–52.0)
Hemoglobin: 16.2 g/dL (ref 13.0–17.0)
MCH: 29.9 pg (ref 26.0–34.0)
MCHC: 34.2 g/dL (ref 30.0–36.0)
MCV: 87.5 fL (ref 80.0–100.0)
NRBC: 0 % (ref 0.0–0.2)
Platelets: 211 10*3/uL (ref 150–400)
RBC: 5.42 MIL/uL (ref 4.22–5.81)
RDW: 13.3 % (ref 11.5–15.5)
WBC: 10.9 10*3/uL — ABNORMAL HIGH (ref 4.0–10.5)

## 2018-01-10 LAB — LIPASE, BLOOD: LIPASE: 32 U/L (ref 11–51)

## 2018-01-10 MED ORDER — WITCH HAZEL-GLYCERIN EX PADS: 1.0000 "application " | MEDICATED_PAD | CUTANEOUS | 0 refills | Status: AC | PRN

## 2018-01-10 MED ORDER — HYDROCORTISONE 2.5 % RE CREA
TOPICAL_CREAM | Freq: Once | RECTAL | Status: AC
  Administered 2018-01-10: 12:00:00 via RECTAL
  Filled 2018-01-10: qty 28.35

## 2018-01-10 MED ORDER — LIDO-PE-GLYCERIN-PETROLATUM 5-0.25-14.4-15 % RE CREA: 1.0000 "application " | TOPICAL_CREAM | Freq: Three times a day (TID) | RECTAL | 0 refills | Status: DC | PRN

## 2018-01-10 MED ORDER — HYDROCORTISONE 2.5 % RE CREA: TOPICAL_CREAM | RECTAL | 0 refills | Status: AC

## 2018-01-10 MED ORDER — LIDO-PE-GLYCERIN-PETROLATUM 5-0.25-14.4-15 % RE CREA: 1.0000 "application " | TOPICAL_CREAM | Freq: Three times a day (TID) | RECTAL | 0 refills | Status: AC | PRN

## 2018-01-10 MED ORDER — LIDOCAINE HCL URETHRAL/MUCOSAL 2 % EX GEL
1.0000 "application " | Freq: Once | CUTANEOUS | Status: AC
  Administered 2018-01-10: 1 via TOPICAL
  Filled 2018-01-10: qty 10

## 2018-01-10 MED ORDER — WITCH HAZEL-GLYCERIN EX PADS: 1.0000 "application " | MEDICATED_PAD | CUTANEOUS | 0 refills | Status: DC | PRN

## 2018-01-10 NOTE — ED Provider Notes (Signed)
[facility] Emergency Department Provider Note  ____________________________________________  Time seen: Approximately 11:06 AM  I have reviewed the triage vital signs and the nursing notes.   HISTORY  Chief Complaint Hemorrhoids    HPI Alvin Ward is a 52 y.o. male that presents to emergency department for evaluation of bright red blood per rectum for 1 day.  Patient states that he felt constipated on Thursday and had a normal sized hard bowel movement.  He was straining last night to have a bowel movement and noticed blood in the toilet.  He had a normal bowel movement last night.  He did not notice any blood in bowel movement or dark stools.  He has continued to have bright red blood per rectum since bowel movement last night.  He states he put on 4 pairs of underwear 2 hours prior and has soaked through underwear.  He has had some overall abdominal discomfort.  No history of hemorrhoids.  He has tried hemorrhoid cream without relief.  No fever, chills, nausea, vomiting.  Past Medical History:  Diagnosis Date  . Diabetes mellitus without complication ([place])   . Skin cancer   . Sleep apnea     Patient Active Problem List   Diagnosis Date Noted  . Cough 03/06/2017    Past Surgical History:  Procedure Laterality Date  . NASAL SINUS SURGERY      Prior to Admission medications   Medication Sig Start Date End Date Taking? Authorizing Provider  azithromycin (ZITHROMAX) 250 MG tablet 2 tablets on day 1, then 1 tablet daily on days 2-5. 03/04/17   Coral Spikes, DO  famotidine (PEPCID) 20 MG tablet Take 1 tablet (20 mg total) by mouth 2 (two) times daily. 10/09/16 10/09/17  Darel Hong, MD  HYDROcodone-homatropine (HYCODAN) 5-1.5 MG/5ML syrup Take 5 mLs by mouth every 6 (six) hours as needed. 03/04/17   Coral Spikes, DO  metFORMIN (GLUCOPHAGE) 500 MG tablet Take 500 mg by mouth 2 (two) times daily with a meal.    [provider]     Allergies Ace inhibitors; Banana; and Latex  Family History  Problem Relation Age of Onset  . Diabetes Mother   . Diabetes Father     Social History Social History   Tobacco Use  . Smoking status: Never Smoker  . Smokeless tobacco: Never Used  Substance Use Topics  . Alcohol use: No  . Drug use: No     Review of Systems  Constitutional: No fever/chills Respiratory:  No SOB. Gastrointestinal: Positive for abdominal pain.  No nausea, no vomiting.  Musculoskeletal: Negative for musculoskeletal pain. Skin: Negative for rash, abrasions, lacerations, ecchymosis.   ____________________________________________   PHYSICAL EXAM:  VITAL SIGNS: ED Triage Vitals  Enc Vitals Group     BP 01/10/18 0824 135/89     Pulse Rate 01/10/18 0824 90     Resp 01/10/18 0824 20     Temp 01/10/18 0824 98 F (36.7 C)     Temp Source 01/10/18 0824 Oral     SpO2 01/10/18 0824 99 %     Weight 01/10/18 0824 (!) 350 lb (158.8 kg)     Height 01/10/18 0824 5\' 10"  (1.778 m)     Head Circumference --      Peak Flow --      Pain Score 01/10/18 0827 3     Pain Loc --      Pain Edu? --      Excl. in [place]? --  Constitutional: Alert and oriented. Well appearing and in no acute distress. Eyes: Conjunctivae are normal. PERRL. EOMI. Head: Atraumatic. ENT:      Ears:      Nose: No congestion/rhinnorhea.      Mouth/Throat: Mucous membranes are moist.  Neck: No stridor.   Cardiovascular: Normal rate, regular rhythm.  Good peripheral circulation. Respiratory: Normal respiratory effort without tachypnea or retractions. Lungs CTAB. Good air entry to the bases with no decreased or absent breath sounds. Gastrointestinal: Bowel sounds 4 quadrants. Soft and nontender to palpation. No guarding or rigidity. No palpable masses. No distention. Rectal: 2 cm by 2 cm tender non reducible hemmorroid to 3:00 position of rectum that is actively bleeding. Musculoskeletal: Full range of motion to all  extremities. No gross deformities appreciated. Neurologic:  Normal speech and language. No gross focal neurologic deficits are appreciated.  Skin:  Skin is warm, dry and intact. No rash noted. Psychiatric: Mood and affect are normal. Speech and behavior are normal. Patient exhibits appropriate insight and judgement.   ____________________________________________   LABS (all labs ordered are listed, but only abnormal results are displayed)  Labs Reviewed  CBC - Abnormal; Notable for the following components:      Result Value   WBC 10.9 (*)    All other components within normal limits  COMPREHENSIVE METABOLIC PANEL - Abnormal; Notable for the following components:   Sodium 133 (*)    Glucose, Bld 219 (*)    Creatinine, Ser 1.32 (*)    Calcium 8.4 (*)    All other components within normal limits  LIPASE, BLOOD  PROTIME-INR   ____________________________________________  EKG   ____________________________________________  RADIOLOGY  No results found.  ____________________________________________    PROCEDURES  Procedure(s) performed:    Procedures    Medications - No data to display   ____________________________________________   INITIAL IMPRESSION / ASSESSMENT AND PLAN / ED COURSE  Pertinent labs & imaging results that were available during my care of the patient were reviewed by me and considered in my medical decision making (see chart for details).  Review of the [place] CSRS was performed in accordance of the [place] prior to dispensing any controlled drugs.   Patient presents emergency department for evaluation of hemorrhoid.  Vital signs and exam are reassuring.  Patient has an actively bleeding external hemorrhoid.  Care was transferred to Dr. Reita Cliche for actively bleeding hemorrhoid.     ____________________________________________  FINAL CLINICAL IMPRESSION(S) / ED DIAGNOSES  Final diagnoses:  None      NEW MEDICATIONS STARTED DURING THIS  VISIT:  ED Discharge Orders    None          This chart was dictated using voice recognition software/Dragon. Despite best efforts to proofread, errors can occur which can change the meaning. Any change was purely unintentional.    Laban Emperor, PA-C 01/10/18 [place], [place], MD 02/05/18 [phone]

## 2018-01-10 NOTE — ED Notes (Signed)
Patient reports cream helped with rectal pain. Awaiting disposition.

## 2018-01-10 NOTE — ED Notes (Addendum)
Assumed care of patient reports history of hemorrhoids, was constipated a few days, when finally had a bowel movement began to bleed from hemorrhoids and has not stopped bleeding since. Bleeding since last night at 10pm, describes blood as bright red blood, denies clots. Reports rectal pain 2/10 at present time. Bleeding has resolved since in ED. Awaiting prescriptions as per patient plan as per patient is to discharge to home. H/H within norm as per lab results.

## 2018-01-10 NOTE — ED Provider Notes (Signed)
[facility]  I accepted care from PA Laban Emperor    Physical exam:   General: NAD.   HEENT:  Atraumatic.  Moist mucus membranes. Respiratory:  No tachypnea.  No grossly audible wheezing Abdomen:  Obese, nontender Rectal: External thrombosed hemmorhoid, mildly tender to palpation.  Dried blood, tiny amount of ooze blood. Extremities: Normal in appearance Skin: Warm and dry.  ____________________________________________    LABS (pertinent positives/negatives)  I, Lisa Roca, MD have personally reviewed the lab reports noted below.  Labs Reviewed  CBC - Abnormal; Notable for the following components:      Result Value   WBC 10.9 (*)    All other components within normal limits  COMPREHENSIVE METABOLIC PANEL - Abnormal; Notable for the following components:   Sodium 133 (*)    Glucose, Bld 219 (*)    Creatinine, Ser 1.32 (*)    Calcium 8.4 (*)    All other components within normal limits  LIPASE, BLOOD  PROTIME-INR     ____________________________________________    RADIOLOGY All xrays were viewed by me. Imaging interpreted by radiologist.  I, Lisa Roca MD have personally reviewed the imaging report noted below.  None  ____________________________________________   PROCEDURES  Procedure(s) performed: None  Procedures  Critical Care performed: None  ____________________________________________   INITIAL IMPRESSION / ASSESSMENT AND PLAN / ED COURSE   Pertinent labs & imaging results that were available during my care of the patient were reviewed by me and considered in my medical decision making (see chart for details).   PA moved patient to [facility] for evaluation due to rectal bleeding.  On my exam, there is a thrombosed hemorrhoid which is moderately tender with some mild oozing.  Patient describes to me that he started with a sensation of a hemorrhoid when he was sitting on Wednesday which is about 3 days ago now.  And  then overnight he had some bleeding initially streaking yesterday and then oozing through 3 pair of underwear that he was wearing by this morning.  He has never had a bleeding hemorrhoid before.  No abdominal pain.  No nausea or vomiting.  On exam, 4/10 pain to external hemorrhoid, some dried blood and a tiny amount of ooze.  Labs are reassuring - no anemia or drop in hemoglobin.  I do not suspect any internal bleeding or life threatening bleeding.  No systemic symptoms.  Within fairly minimal and certainly manageable pain, I do not think that excising the thrombosed external hemorrhoid is reasonable/warrented at this point.  We discussed conservative management and return precautions.    CONSULTATIONS: None    Patient / Family / Caregiver informed of clinical course, medical decision-making process, and agree with plan.   I discussed return precautions, follow-up instructions, and discharged instructions with patient and/or family.   Discharge instructions:  Your evaluated for hemorrhoid which is bleeding, and as we discussed your exam and evaluation overall reassuring.  Please get over-the-counter sitz bath tub and soak twice per day with warm water.  Take over the counter stool softener such as docusate (also called Colace) - use as directed.  Take over the counter fiber supplement such as psyllium  - use as directed on label to help with constipation.  Return to the ER for any worsening or uncontrolled pain, fever, abdominal pain, any worsening or uncontrolled bleeding or dizziness, passing out, or any other symptoms concerning to you.      ____________________________________________   FINAL CLINICAL IMPRESSION(S) / ED  DIAGNOSES  Final diagnoses:  External thrombosed hemorrhoids  Bleeding hemorrhoid        Lisa Roca, MD 01/10/18 1213

## 2018-01-10 NOTE — ED Triage Notes (Signed)
Thursday states constipation. Then began having hemorrhoid pain with bleeding.   A&O, ambulatory. No distress noted.

## 2018-01-10 NOTE — ED Notes (Signed)
Rectal cream provided.

## 2018-01-10 NOTE — ED Notes (Signed)
Rectal bleeding - see pa note

## 2018-01-10 NOTE — Discharge Instructions (Addendum)
Your evaluated for hemorrhoid which is bleeding, and as we discussed your exam and evaluation overall reassuring.  Please get over-the-counter sitz bath tub and soak twice per day with warm water.  Take over the counter stool softener such as docusate (also called Colace) - use as directed.  Take over the counter fiber supplement such as psyllium  - use as directed on label to help with constipation.  Return to the ER for any worsening or uncontrolled pain, fever, abdominal pain, any worsening or uncontrolled bleeding or dizziness, passing out, or any other symptoms concerning to you.

## 2019-01-13 IMAGING — CR DG CHEST 2V
1 series · 2 of 2 positions shown · non-contrast
Comparison: Radiograph 11/25/2012

CLINICAL DATA: Sudden onset of left-sided chest pain.

EXAM:
CHEST  2 VIEW

[Series 1: dg chest 2 view · 0.14mm/px · 2 of 2 slices shown]
[im 1/2]
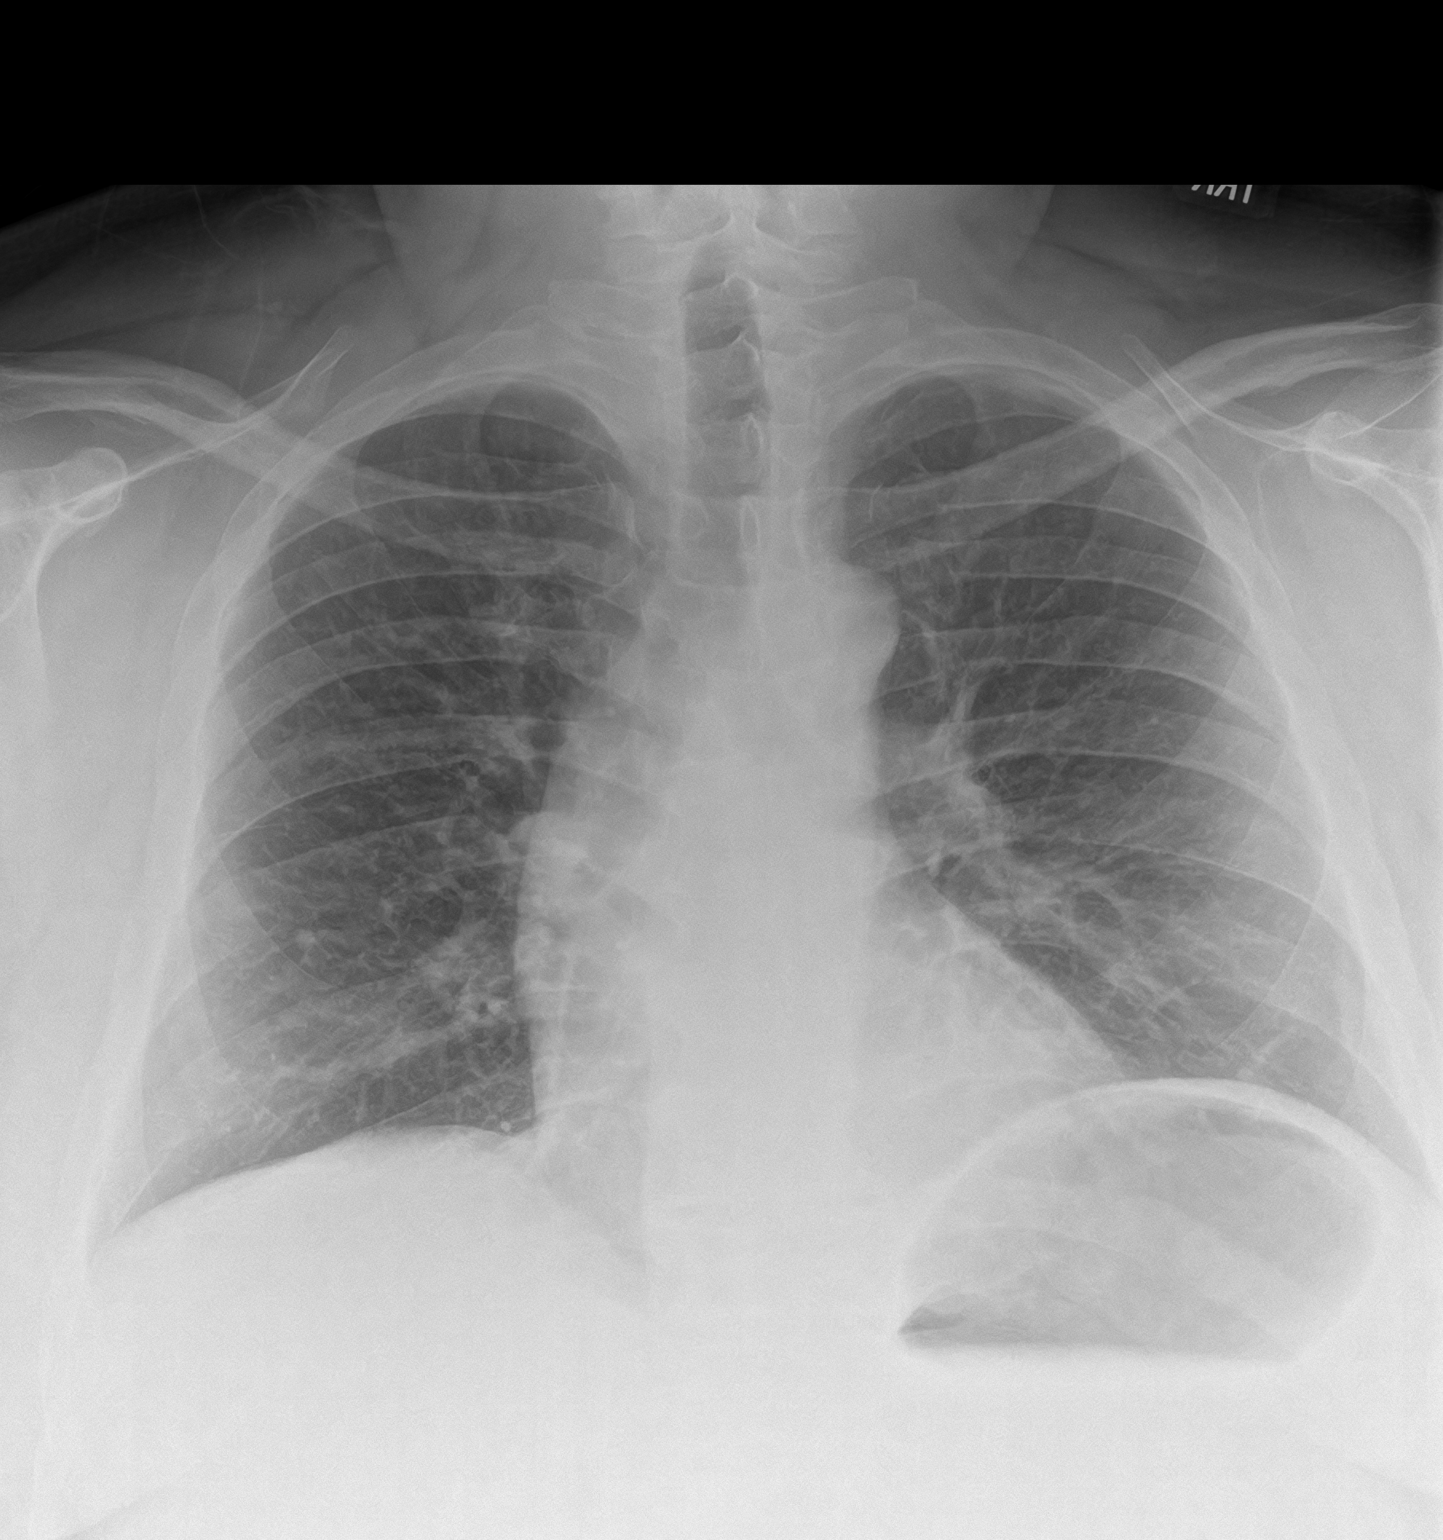
[im 2/2]
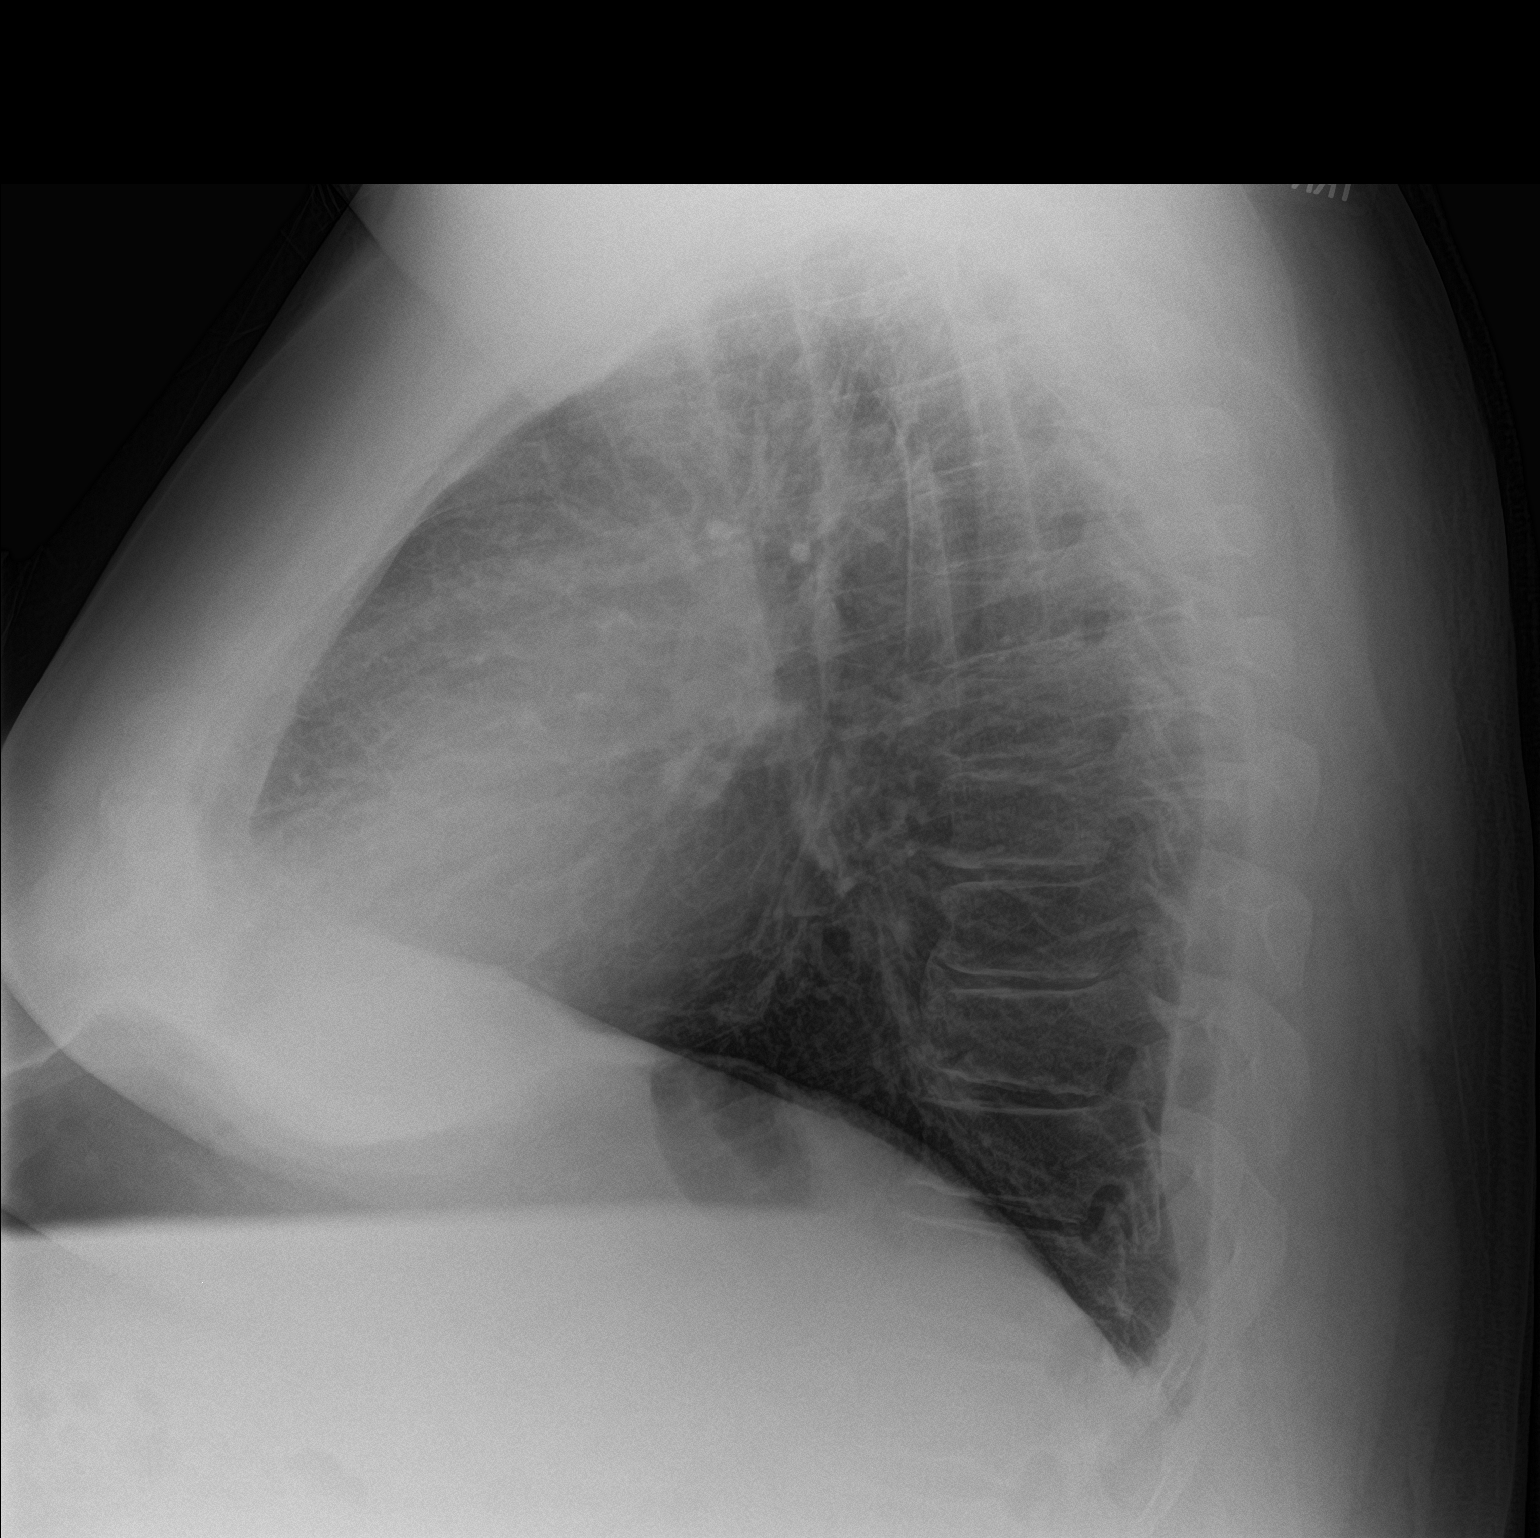

[2 of 2 positions shown; findings below may reference images not displayed]

FINDINGS: The cardiomediastinal contours are normal. The lungs are clear.
Pulmonary vasculature is normal. No consolidation, pleural effusion,
or pneumothorax. No acute osseous abnormalities are seen.
IMPRESSION: No acute abnormality.

## 2019-02-07 ENCOUNTER — Encounter: Payer: Self-pay | Admitting: Emergency Medicine

## 2019-02-07 ENCOUNTER — Emergency Department: Payer: Self-pay

## 2019-02-07 ENCOUNTER — Other Ambulatory Visit: Payer: Self-pay

## 2019-02-07 ENCOUNTER — Emergency Department
Admission: EM | Admit: 2019-02-07 | Discharge: 2019-02-07 | Disposition: A | Discharge status: Home / Self Care | Payer: Self-pay | Attending: Emergency Medicine | Admitting: Emergency Medicine

## 2019-02-07 DIAGNOSIS — R0789 Other chest pain: Secondary | ICD-10-CM | POA: Insufficient documentation

## 2019-02-07 DIAGNOSIS — R079 Chest pain, unspecified: Secondary | ICD-10-CM

## 2019-02-07 DIAGNOSIS — E119 Type 2 diabetes mellitus without complications: Secondary | ICD-10-CM | POA: Insufficient documentation

## 2019-02-07 DIAGNOSIS — Z91040 Latex allergy status: Secondary | ICD-10-CM | POA: Insufficient documentation

## 2019-02-07 DIAGNOSIS — Z85828 Personal history of other malignant neoplasm of skin: Secondary | ICD-10-CM | POA: Insufficient documentation

## 2019-02-07 DIAGNOSIS — Z7984 Long term (current) use of oral hypoglycemic drugs: Secondary | ICD-10-CM | POA: Insufficient documentation

## 2019-02-07 LAB — BASIC METABOLIC PANEL
Anion gap: 11 (ref 5–15)
BUN: 15 mg/dL (ref 6–20)
CO2: 23 mmol/L (ref 22–32)
Calcium: 9.2 mg/dL (ref 8.9–10.3)
Chloride: 99 mmol/L (ref 98–111)
Creatinine, Ser: 1.12 mg/dL (ref 0.61–1.24)
GFR calc Af Amer: >60 mL/min (ref >60)
GFR calc non Af Amer: >60 mL/min (ref >60)
Glucose, Bld: 201 mg/dL — ABNORMAL HIGH (ref 70–99)
Potassium: 4.2 mmol/L (ref 3.5–5.1)
Sodium: 133 mmol/L — ABNORMAL LOW (ref 135–145)

## 2019-02-07 LAB — CBC
HCT: 52.3 % — ABNORMAL HIGH (ref 39.0–52.0)
Hemoglobin: 18 g/dL — ABNORMAL HIGH (ref 13.0–17.0)
MCH: 29.6 pg (ref 26.0–34.0)
MCHC: 34.4 g/dL (ref 30.0–36.0)
MCV: 86 fL (ref 80.0–100.0)
Platelets: 244 10*3/uL (ref 150–400)
RBC: 6.08 MIL/uL — ABNORMAL HIGH (ref 4.22–5.81)
RDW: 13.2 % (ref 11.5–15.5)
WBC: 9.5 10*3/uL (ref 4.0–10.5)
nRBC: 0 % (ref 0.0–0.2)

## 2019-02-07 LAB — TROPONIN I (HIGH SENSITIVITY)
Troponin I (High Sensitivity): 3 ng/L (ref <18)
Troponin I (High Sensitivity): 3 ng/L (ref <18)

## 2019-02-07 MED ORDER — SODIUM CHLORIDE 0.9% FLUSH: 3.0000 mL | Freq: Once | INTRAVENOUS | Status: DC

## 2019-02-07 MED ORDER — ASPIRIN 81 MG PO CHEW
324.0000 mg | CHEWABLE_TABLET | Freq: Once | ORAL | Status: AC
  Administered 2019-02-07: 21:00:00 324 mg via ORAL
  Filled 2019-02-07: qty 4

## 2019-02-07 MED ORDER — NITROGLYCERIN 0.4 MG SL SUBL
0.4000 mg | SUBLINGUAL_TABLET | Freq: Once | SUBLINGUAL | Status: AC
  Administered 2019-02-07: 21:00:00 0.4 mg via SUBLINGUAL
  Filled 2019-02-07: qty 1

## 2019-02-07 NOTE — ED Provider Notes (Signed)
[facility] Emergency Department Provider Note   ____________________________________________    I have reviewed the triage vital signs and the nursing notes.   HISTORY  Chief Complaint Chest Pain     HPI Alvin Ward is a 54 y.o. male with history of diabetes who presents with complaints of chest pain.  Patient reports yesterday he was scraping the ice off of his car and felt a tightness in his chest which seem to travel to his right shoulder as well, that improved with rest.  Intermittently throughout the day he has had similar chest tightness.  Currently feels well.  Denies shortness of breath.  No pleurisy.  Has a strong family history of heart disease, does not smoke.  Has not taken anything for this  Past Medical History:  Diagnosis Date  . Diabetes mellitus without complication ([place])   . Skin cancer   . Sleep apnea     Patient Active Problem List   Diagnosis Date Noted  . Cough 03/06/2017    Past Surgical History:  Procedure Laterality Date  . NASAL SINUS SURGERY      Prior to Admission medications   Medication Sig Start Date End Date Taking? Authorizing Provider  albuterol (PROVENTIL HFA;VENTOLIN HFA) 108 (90 Base) MCG/ACT inhaler Inhale into the lungs. Inhale 1-2 puffs every 4 hours as needed 05/01/17 05/02/18  [provider]  cetirizine (ZYRTEC ALLERGY) 10 MG tablet Take 10 mg by mouth daily.    [provider]  famotidine (PEPCID) 20 MG tablet Take 1 tablet (20 mg total) by mouth 2 (two) times daily. 10/09/16 01/10/18  Darel Hong, MD  Lido-PE-Glycerin-Petrolatum (PREPARATION H RAPID RELIEF) 5-0.25-14.4-15 % CREA Place 1 application rectally 3 (three) times daily as needed (hemorrhoidal pain). 01/10/18   Lisa Roca, MD  metFORMIN (GLUCOPHAGE) 500 MG tablet Take 500 mg by mouth 2 (two) times daily with a meal.    [provider]  witch hazel-glycerin (TUCKS) pad Apply 1 application topically as needed  for itching. 01/10/18   Lisa Roca, MD     Allergies Ace inhibitors, Banana, Latex, and Pecan pollen  Family History  Problem Relation Age of Onset  . Diabetes Mother   . Diabetes Father     Social History Social History   Tobacco Use  . Smoking status: Never Smoker  . Smokeless tobacco: Never Used  Substance Use Topics  . Alcohol use: No  . Drug use: No    Review of Systems  Constitutional: No fever/chills Eyes: No visual changes.  ENT: No sore throat. Cardiovascular: As above Respiratory: Denies shortness of breath. Gastrointestinal: No abdominal pain.  Genitourinary: Negative for dysuria. Musculoskeletal: Negative for back pain. Skin: Negative for rash. Neurological: Negative for headaches    ____________________________________________   PHYSICAL EXAM:  VITAL SIGNS: ED Triage Vitals  Enc Vitals Group     BP 02/07/19 1714 (!) 147/91     Pulse Rate 02/07/19 1714 85     Resp 02/07/19 1714 16     Temp 02/07/19 1714 98.2 F (36.8 C)     Temp Source 02/07/19 1714 Oral     SpO2 02/07/19 1714 96 %     Weight 02/07/19 1710 (!) 154.2 kg (340 lb)     Height 02/07/19 1710 1.778 m (5\' 10" )     Head Circumference --      Peak Flow --      Pain Score 02/07/19 1709 4     Pain Loc --  Pain Edu? --      Excl. in [place]? --     Constitutional: Alert and oriented.   Nose: No congestion/rhinnorhea. Mouth/Throat: Mucous membranes are moist.    Cardiovascular: Normal rate, regular rhythm. Grossly normal heart sounds.  Good peripheral circulation. Respiratory: Normal respiratory effort.  No retractions. Lungs CTAB. Gastrointestinal: Soft and nontender. No distention.    Musculoskeletal: No lower extremity tenderness nor edema.  Warm and well perfused Neurologic:  Normal speech and language. No gross focal neurologic deficits are appreciated.  Skin:  Skin is warm, dry and intact. No rash noted. Psychiatric: Mood and affect are normal. Speech and behavior are  normal.  ____________________________________________   LABS (all labs ordered are listed, but only abnormal results are displayed)  Labs Reviewed  BASIC METABOLIC PANEL - Abnormal; Notable for the following components:      Result Value   Sodium 133 (*)    Glucose, Bld 201 (*)    All other components within normal limits  CBC - Abnormal; Notable for the following components:   RBC 6.08 (*)    Hemoglobin 18.0 (*)    HCT 52.3 (*)    All other components within normal limits  TROPONIN I (HIGH SENSITIVITY)  TROPONIN I (HIGH SENSITIVITY)   ____________________________________________  EKG  ED ECG REPORT I, Lavonia Drafts, the attending physician, personally viewed and interpreted this ECG.  Date: 02/07/2019  Rhythm: normal sinus rhythm QRS Axis: normal Intervals: normal ST/T Wave abnormalities: normal Narrative Interpretation: no evidence of acute ischemia  ____________________________________________  RADIOLOGY  Chest x-ray unremarkable ____________________________________________   PROCEDURES  Procedure(s) performed: No  Procedures   Critical Care performed: No ____________________________________________   INITIAL IMPRESSION / ASSESSMENT AND PLAN / ED COURSE  Pertinent labs & imaging results that were available during my care of the patient were reviewed by me and considered in my medical decision making (see chart for details).  Patient presents with reports of chest pain, now improved.  EKG is reassuring, troponin is normal, lab work otherwise unremarkable except for mildly elevated hemoglobin possibly related to dehydration, non-smoker.  Positive family history of heart disease  No evidence of ACS or unstable angina at this time however discussed with patient that strongly recommend and encourage close follow-up with cardiology.  I discussed with Dr. Ubaldo Glassing of cardiology who agrees with outpatient follow-up, patient to call his office in the morning for  same-day follow-up  Strict return precautions discussed with patient who agrees with this plan    ____________________________________________   FINAL CLINICAL IMPRESSION(S) / ED DIAGNOSES  Final diagnoses:  Chest pain, unspecified type        Note:  This document was prepared using Dragon voice recognition software and may include unintentional dictation errors.   Lavonia Drafts, MD 02/07/19 2311

## 2019-02-07 NOTE — ED Triage Notes (Signed)
Pt to ED via POV c/o chest pain. Pt states that pain started yesterday morning, pt states that pain had consistently been there but has eased off. Pt states that he was at work when the pain started yesterday. Pt was scraping the car window and got sick to his stomach and started having pain in center of his chest that radiated into his right arm. Pt is in NAD at this time. Pt states that he is still having chest pain at this time.

## 2019-02-07 NOTE — ED Triage Notes (Addendum)
FIRST NURSE NOTE- here for CP. Ambulatory to check in. Declined wheel chair. Pulled for EKG.  Did have a covid exposure.

## 2019-03-22 ENCOUNTER — Other Ambulatory Visit: Payer: Self-pay | Admitting: Rehabilitative and Restorative Service Providers"

## 2019-03-22 DIAGNOSIS — R079 Chest pain, unspecified: Secondary | ICD-10-CM

## 2019-03-22 DIAGNOSIS — E118 Type 2 diabetes mellitus with unspecified complications: Secondary | ICD-10-CM

## 2019-03-22 DIAGNOSIS — Z8249 Family history of ischemic heart disease and other diseases of the circulatory system: Secondary | ICD-10-CM

## 2019-04-01 ENCOUNTER — Other Ambulatory Visit: Payer: Self-pay

## 2019-04-01 ENCOUNTER — Encounter
Admission: RE | Admit: 2019-04-01 | Discharge: 2019-04-01 | Disposition: A | Discharge status: Home / Self Care | Payer: Commercial Managed Care - PPO | Source: Ambulatory Visit | Attending: Rehabilitative and Restorative Service Providers" | Admitting: Rehabilitative and Restorative Service Providers"

## 2019-04-01 DIAGNOSIS — E118 Type 2 diabetes mellitus with unspecified complications: Secondary | ICD-10-CM | POA: Diagnosis present

## 2019-04-01 DIAGNOSIS — Z8249 Family history of ischemic heart disease and other diseases of the circulatory system: Secondary | ICD-10-CM | POA: Diagnosis present

## 2019-04-01 DIAGNOSIS — R079 Chest pain, unspecified: Secondary | ICD-10-CM | POA: Insufficient documentation

## 2019-04-01 MED ORDER — REGADENOSON 0.4 MG/5ML IV SOLN
0.4000 mg | Freq: Once | INTRAVENOUS | Status: AC
  Administered 2019-04-01: 0.4 mg via INTRAVENOUS
  Filled 2019-04-01: qty 5

## 2019-04-01 MED ORDER — TECHNETIUM TC 99M TETROFOSMIN IV KIT
30.0000 | PACK | Freq: Once | INTRAVENOUS | Status: AC | PRN
  Administered 2019-04-01: 32.549 via INTRAVENOUS

## 2019-04-02 ENCOUNTER — Encounter
Admission: RE | Admit: 2019-04-02 | Discharge: 2019-04-02 | Disposition: A | Discharge status: Home / Self Care | Payer: Commercial Managed Care - PPO | Source: Ambulatory Visit | Attending: Rehabilitative and Restorative Service Providers" | Admitting: Rehabilitative and Restorative Service Providers"

## 2019-04-02 DIAGNOSIS — R079 Chest pain, unspecified: Secondary | ICD-10-CM | POA: Insufficient documentation

## 2019-04-02 DIAGNOSIS — Z8249 Family history of ischemic heart disease and other diseases of the circulatory system: Secondary | ICD-10-CM | POA: Insufficient documentation

## 2019-04-02 DIAGNOSIS — E118 Type 2 diabetes mellitus with unspecified complications: Secondary | ICD-10-CM | POA: Insufficient documentation

## 2019-04-02 LAB — NM MYOCAR MULTI W/SPECT W/WALL MOTION / EF
Estimated workload: 1 METS
Exercise duration (min): 1 min
Exercise duration (sec): 13 s
LV dias vol: 67 mL (ref 62–150)
LV sys vol: 24 mL
Peak HR: 103 {beats}/min
Percent HR: 61 %
Rest HR: 82 {beats}/min
SDS: 0
SRS: 7
SSS: 2
TID: 0.79

## 2019-04-02 MED ORDER — TECHNETIUM TC 99M TETROFOSMIN IV KIT
30.0000 | PACK | Freq: Once | INTRAVENOUS | Status: AC | PRN
  Administered 2019-04-02: 32.43 via INTRAVENOUS

## 2019-04-30 ENCOUNTER — Ambulatory Visit: Payer: Commercial Managed Care - PPO | Attending: Internal Medicine

## 2019-04-30 DIAGNOSIS — Z23 Encounter for immunization: Secondary | ICD-10-CM

## 2019-04-30 NOTE — Progress Notes (Signed)
   Covid-19 Vaccination Clinic  Name:  Alvin Ward    MRN: [id number] DOB: 12-Jun-1965  04/30/2019  Alvin Ward was observed post Covid-19 immunization for 15 minutes without incident. He was provided with Vaccine Information Sheet and instruction to access the V-Safe system.   Alvin Ward was instructed to call 911 with any severe reactions post vaccine: Marland Kitchen Difficulty breathing  . Swelling of face and throat  . A fast heartbeat  . A bad rash all over body  . Dizziness and weakness   Immunizations Administered    Name Date Dose VIS Date Route   Pfizer COVID-19 Vaccine 04/30/2019  8:28 AM 0.3 mL 01/08/2019 Intramuscular   Manufacturer: [employer]   Lot: [id number]   [place]: [id number]

## 2019-05-25 ENCOUNTER — Ambulatory Visit: Payer: Commercial Managed Care - PPO | Attending: Internal Medicine

## 2019-05-25 DIAGNOSIS — Z23 Encounter for immunization: Secondary | ICD-10-CM

## 2019-05-25 NOTE — Progress Notes (Signed)
   Covid-19 Vaccination Clinic  Name:  Alvin Ward    MRN: [id number] DOB: 05-May-1965  05/25/2019  [name] was observed post Covid-19 immunization for 15 minutes without incident. He was provided with Vaccine Information Sheet and instruction to access the V-Safe system.   [name] was instructed to call 911 with any severe reactions post vaccine: Marland Kitchen Difficulty breathing  . Swelling of face and throat  . A fast heartbeat  . A bad rash all over body  . Dizziness and weakness   Immunizations Administered    Name Date Dose VIS Date Route   Pfizer COVID-19 Vaccine 05/25/2019  4:18 PM 0.3 mL 03/24/2018 Intramuscular   Manufacturer: [place]   Lot: [id number]   [place]: [id number]

## 2019-06-10 IMAGING — CR DG CHEST 2V
2 series · 2 of 2 positions shown · non-contrast
Comparison: Chest x-ray dated October 09, 2016.

CLINICAL DATA: Productive cough and chest tightness for the past 5
days.

EXAM:
CHEST  2 VIEW

[chest pa]
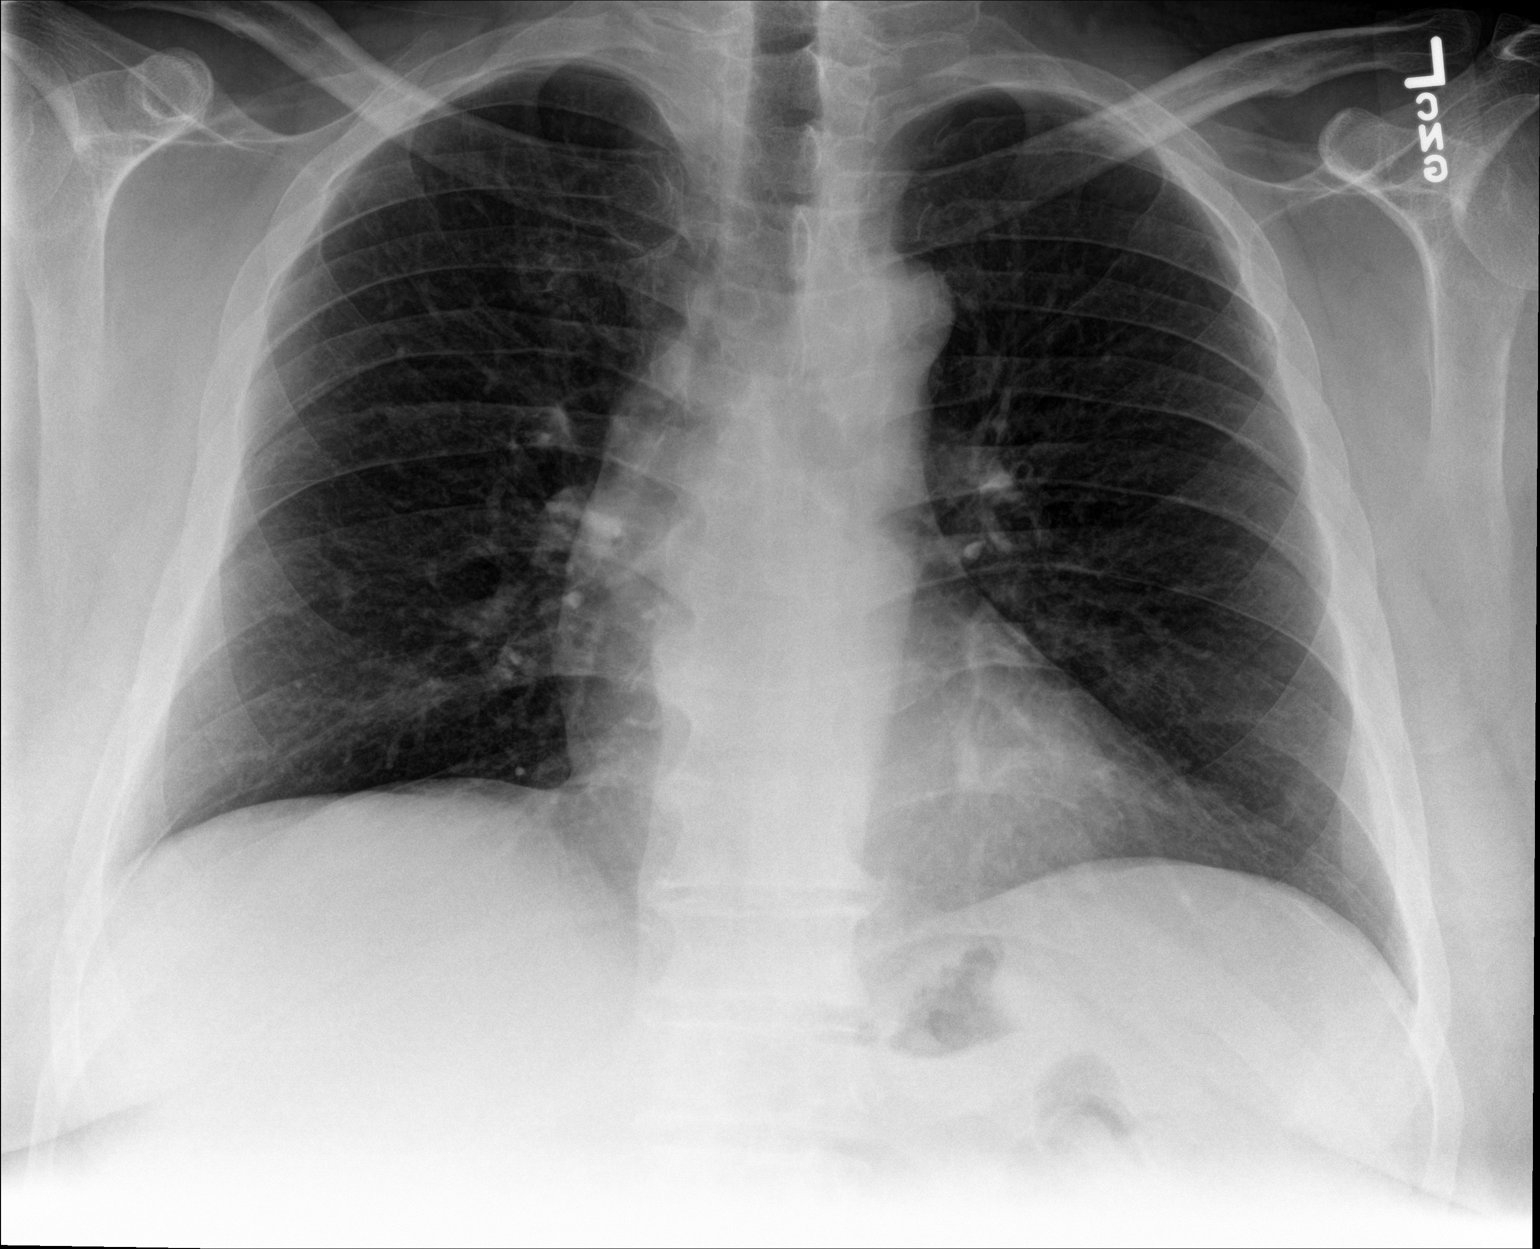

[chest lat]
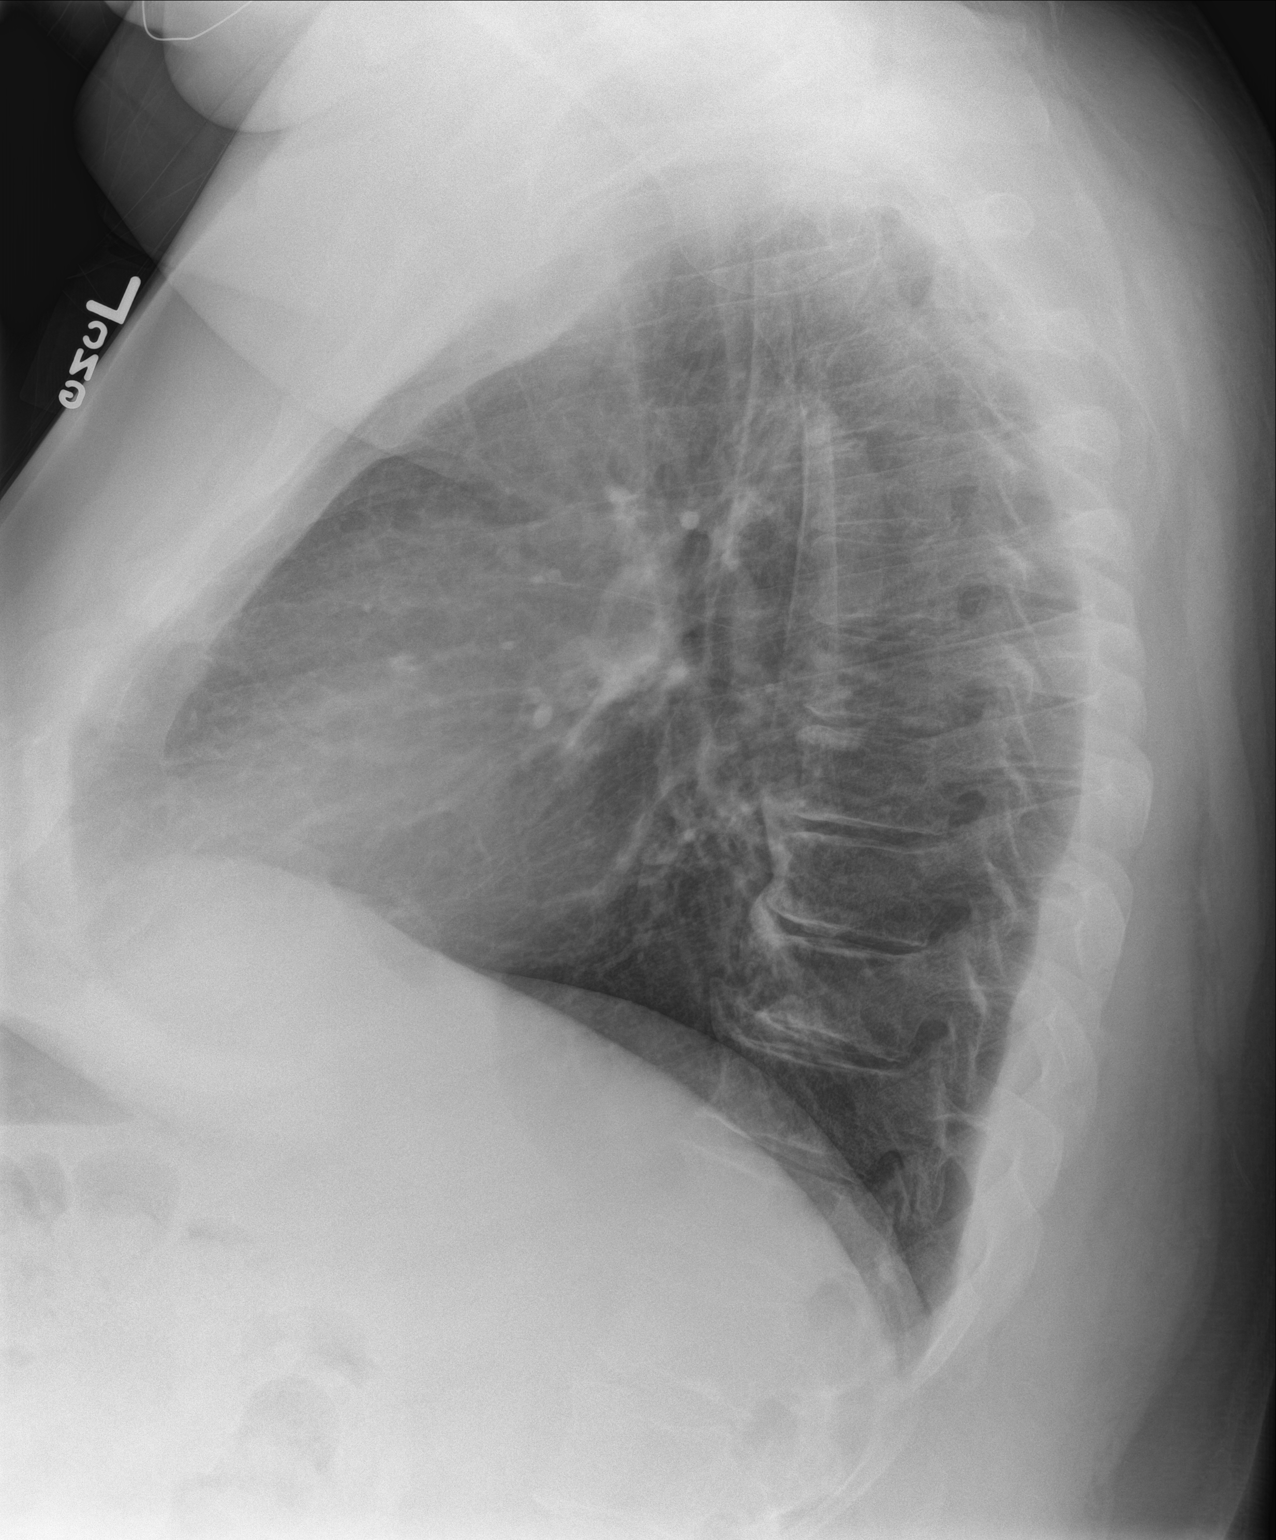

[2 of 2 positions shown; findings below may reference images not displayed]

FINDINGS: The heart size and mediastinal contours are within normal limits.
Both lungs are clear. The visualized skeletal structures are
unremarkable.
IMPRESSION: No active cardiopulmonary disease.

## 2019-11-03 ENCOUNTER — Other Ambulatory Visit: Payer: Self-pay

## 2019-11-03 ENCOUNTER — Encounter: Payer: Self-pay | Admitting: Physician Assistant

## 2019-11-03 ENCOUNTER — Ambulatory Visit
Admission: EM | Admit: 2019-11-03 | Discharge: 2019-11-03 | Disposition: A | Discharge status: Home / Self Care | Payer: Commercial Managed Care - PPO | Attending: Family Medicine | Admitting: Family Medicine

## 2019-11-03 DIAGNOSIS — R0981 Nasal congestion: Secondary | ICD-10-CM | POA: Diagnosis not present

## 2019-11-03 DIAGNOSIS — R059 Cough, unspecified: Secondary | ICD-10-CM

## 2019-11-03 DIAGNOSIS — Z20822 Contact with and (suspected) exposure to covid-19: Secondary | ICD-10-CM | POA: Insufficient documentation

## 2019-11-03 DIAGNOSIS — B349 Viral infection, unspecified: Secondary | ICD-10-CM | POA: Diagnosis not present

## 2019-11-03 LAB — SARS CORONAVIRUS 2 (TAT 6-24 HRS): SARS Coronavirus 2: NEGATIVE

## 2019-11-03 NOTE — ED Provider Notes (Signed)
[facility]    CSN: [id number] Arrival date & time: 11/03/19  1048      History   Chief Complaint Chief Complaint  Patient presents with  . Cough    HPI Alvin Ward is a 54 y.o. male presenting for 4-day history of cough, nasal congestion and sneezing.  Patient has been taking over-the-counter Mucinex and Robitussin with some relief.  He is also been using albuterol inhaler which seems to help.  He says he has a touch of asthma.  Patient denies any fever, fatigue, body aches, chest pain, shortness of breath, abdominal pain, nausea, vomiting, diarrhea.  Denies any known Covid exposure.  Patient is fully vaccinated for Covid.  He does report a previous history of Covid in May or June 2020.  Patient has no other complaints or concerns.  HPI  Past Medical History:  Diagnosis Date  . Diabetes mellitus without complication ([place])   . Skin cancer   . Sleep apnea     Patient Active Problem List   Diagnosis Date Noted  . Cough 03/06/2017    Past Surgical History:  Procedure Laterality Date  . NASAL SINUS SURGERY         Home Medications    Prior to Admission medications   Medication Sig Start Date End Date Taking? Authorizing Provider  albuterol (PROVENTIL HFA;VENTOLIN HFA) 108 (90 Base) MCG/ACT inhaler Inhale into the lungs. Inhale 1-2 puffs every 4 hours as needed 05/01/17 11/03/19 Yes [provider]  aspirin 81 MG chewable tablet Chew by mouth daily.   Yes [provider]  atorvastatin (LIPITOR) 10 MG tablet Take 10 mg by mouth daily.   Yes [provider]  cetirizine (ZYRTEC ALLERGY) 10 MG tablet Take 10 mg by mouth daily.   Yes [provider]  metFORMIN (GLUCOPHAGE) 500 MG tablet Take 500 mg by mouth 2 (two) times daily with a meal.   Yes [provider]  omeprazole (PRILOSEC) 20 MG capsule Take 20 mg by mouth daily.   Yes [provider]  witch hazel-glycerin (TUCKS) pad Apply 1 application topically  as needed for itching. 01/10/18  Yes Lisa Roca, MD  famotidine (PEPCID) 20 MG tablet Take 1 tablet (20 mg total) by mouth 2 (two) times daily. 10/09/16 01/10/18  Darel Hong, MD  Lido-PE-Glycerin-Petrolatum (PREPARATION H RAPID RELIEF) 5-0.25-14.4-15 % CREA Place 1 application rectally 3 (three) times daily as needed (hemorrhoidal pain). 01/10/18   Lisa Roca, MD    Family History Family History  Problem Relation Age of Onset  . Diabetes Mother   . Diabetes Father     Social History Social History   Tobacco Use  . Smoking status: Never Smoker  . Smokeless tobacco: Never Used  Vaping Use  . Vaping Use: Never used  Substance Use Topics  . Alcohol use: No  . Drug use: No     Allergies   Ace inhibitors, Banana, Latex, and Pecan pollen   Review of Systems Review of Systems  Constitutional: Negative for fatigue and fever.  HENT: Positive for congestion and sneezing. Negative for rhinorrhea, sinus pressure, sinus pain and sore throat.   Respiratory: Positive for cough. Negative for shortness of breath.   Gastrointestinal: Negative for abdominal pain, diarrhea, nausea and vomiting.  Musculoskeletal: Negative for myalgias.  Neurological: Negative for weakness, light-headedness and headaches.  Hematological: Negative for adenopathy.     Physical Exam Triage Vital Signs ED Triage Vitals  Enc Vitals Group     BP 11/03/19 1118 (!)  162/106     Pulse Rate 11/03/19 1118 89     Resp 11/03/19 1118 20     Temp --      Temp src --      SpO2 11/03/19 1118 94 %     Weight --      Height --      Head Circumference --      Peak Flow --      Pain Score 11/03/19 1121 0     Pain Loc --      Pain Edu? --      Excl. in [place]? --    No data found.  Updated Vital Signs BP (!) 162/106 (BP Location: Left Wrist)   Pulse 83   Temp 98.2 F (36.8 C)   Resp 20   SpO2 96%      Physical Exam Vitals and nursing note reviewed.  Constitutional:      General: He is not in acute  distress.    Appearance: Normal appearance. He is well-developed. He is not ill-appearing.  HENT:     Head: Normocephalic and atraumatic.     Nose: Congestion and rhinorrhea present.     Mouth/Throat:     Mouth: Mucous membranes are moist.     Pharynx: Oropharynx is clear.  Eyes:     General: No scleral icterus.    Conjunctiva/sclera: Conjunctivae normal.  Cardiovascular:     Rate and Rhythm: Normal rate and regular rhythm.     Heart sounds: Normal heart sounds.  Pulmonary:     Effort: Pulmonary effort is normal. No respiratory distress.     Breath sounds: Normal breath sounds.  Musculoskeletal:     Cervical back: Neck supple.  Lymphadenopathy:     Cervical: No cervical adenopathy.  Skin:    General: Skin is warm and dry.  Neurological:     General: No focal deficit present.     Mental Status: He is alert. Mental status is at baseline.     Motor: No weakness.     Gait: Gait normal.  Psychiatric:        Mood and Affect: Mood normal.        Behavior: Behavior normal.        Thought Content: Thought content normal.      UC Treatments / Results  Labs (all labs ordered are listed, but only abnormal results are displayed) Labs Reviewed  SARS CORONAVIRUS 2 (TAT 6-24 HRS)    EKG   Radiology No results found.  Procedures Procedures (including critical care time)  Medications Ordered in UC Medications - No data to display  Initial Impression / Assessment and Plan / UC Course  I have reviewed the triage vital signs and the nursing notes.  Pertinent labs & imaging results that were available during my care of the patient were reviewed by me and considered in my medical decision making (see chart for details).   Covid testing obtained.  CDC guidelines, isolation protocol and ED precautions discussed if positive.  Exam shows chest is clear to auscultation.  BP elevated at 162/106.  Oxygen stable at 96%.  Patient states that he does not need any new cough medication as  what he is taking is working for him.  He denies feeling any worse.  Advised patient he likely has a viral infection which will take 7 to 10 days to get better.  Advised him to follow-up with [place] for any new or worsening symptoms including fever, worsening cough, or  breathing difficulty.  Final Clinical Impressions(s) / UC Diagnoses   Final diagnoses:  Viral illness  Cough  Nasal congestion     Discharge Instructions     URI/COLD SYMPTOMS: Your exam today is consistent with a viral illness. Antibiotics are not indicated at this time. Use medications as directed, including cough syrup, nasal saline, and decongestants. Your symptoms should improve over the next few days and resolve within 7-10 days. Increase rest and fluids. F/u if symptoms worsen or predominate such as sore throat, ear pain, productive cough, shortness of breath, or if you develop high fevers or worsening fatigue over the next several days.    You have received COVID testing today either for positive exposure, concerning symptoms that could be related to COVID infection, screening purposes, or re-testing after confirmed positive.  Your test obtained today checks for active viral infection in the last 1-2 weeks. If your test is negative now, you can still test positive later. So, if you do develop symptoms you should either get re-tested and/or isolate x 10 days. Please follow CDC guidelines.  While Rapid antigen tests come back in 15-20 minutes, send out PCR/molecular test results typically come back within 24 hours. In the mean time, if you are symptomatic, assume this could be a positive test and treat/monitor yourself as if you do have COVID.   We will call with test results. Please download the MyChart app and set up a profile to access test results.   If symptomatic, go home and rest. Push fluids. Take Tylenol as needed for discomfort. Gargle warm salt water. Throat lozenges. Take Mucinex DM or Robitussin for cough.  Humidifier in bedroom to ease coughing. Warm showers. Also review the COVID handout for more information.  COVID-19 INFECTION: The incubation period of COVID-19 is approximately 14 days after exposure, with most symptoms developing in roughly 4-5 days. Symptoms may range in severity from mild to critically severe. Roughly 80% of those infected will have mild symptoms. People of any age may become infected with COVID-19 and have the ability to transmit the virus. The most common symptoms include: fever, fatigue, cough, body aches, headaches, sore throat, nasal congestion, shortness of breath, nausea, vomiting, diarrhea, changes in smell and/or taste.    COURSE OF ILLNESS Some patients may begin with mild disease which can progress quickly into critical symptoms. If your symptoms are worsening please call ahead to the Emergency Department and proceed there for further treatment. Recovery time appears to be roughly 1-2 weeks for mild symptoms and 3-6 weeks for severe disease.   GO IMMEDIATELY TO ER FOR FEVER YOU ARE UNABLE TO GET DOWN WITH TYLENOL, BREATHING PROBLEMS, CHEST PAIN, FATIGUE, LETHARGY, INABILITY TO EAT OR DRINK, ETC  QUARANTINE AND ISOLATION: To help decrease the spread of COVID-19 please remain isolated if you have COVID infection or are highly suspected to have COVID infection. This means -stay home and isolate to one room in the home if you live with others. Do not share a bed or bathroom with others while ill, sanitize and wipe down all countertops and keep common areas clean and disinfected. You may discontinue isolation if you have a mild case and are asymptomatic 10 days after symptom onset as long as you have been fever free >24 hours without having to take Motrin or Tylenol. If your case is more severe (meaning you develop pneumonia or are admitted in the hospital), you may have to isolate longer.   If you have been in close contact (within  6 feet) of someone diagnosed with COVID 19, you  are advised to quarantine in your home for 14 days as symptoms can develop anywhere from 2-14 days after exposure to the virus. If you develop symptoms, you  must isolate.  Most current guidelines for COVID after exposure -isolate 10 days if you ARE NOT tested for COVID as long as symptoms do not develop -isolate 7 days if you are tested and remain asymptomatic -You do not necessarily need to be tested for COVID if you have + exposure and        develop   symptoms. Just isolate at home x10 days from symptom onset During this global pandemic, CDC advises to practice social distancing, try to stay at least 76ft away from others at all times. Wear a face covering. Wash and sanitize your hands regularly and avoid going anywhere that is not necessary.  KEEP IN MIND THAT THE COVID TEST IS NOT 100% ACCURATE AND YOU SHOULD STILL DO EVERYTHING TO PREVENT POTENTIAL SPREAD OF VIRUS TO OTHERS (WEAR MASK, WEAR GLOVES, [place] HANDS AND SANITIZE REGULARLY). IF INITIAL TEST IS NEGATIVE, THIS MAY NOT MEAN YOU ARE DEFINITELY NEGATIVE. MOST ACCURATE TESTING IS DONE 5-7 DAYS AFTER EXPOSURE.   It is not advised by CDC to get re-tested after receiving a positive COVID test since you can still test positive for weeks to months after you have already cleared the virus.   *If you have not been vaccinated for COVID, I strongly suggest you consider getting vaccinated as long as there are no contraindications.      ED Prescriptions    None     PDMP not reviewed this encounter.   Danton Clap, PA-C 11/03/19 1223

## 2019-11-03 NOTE — Discharge Instructions (Addendum)

## 2019-11-03 NOTE — ED Triage Notes (Signed)
Pt presents with worsening cough, nasal congestion x 4 days.   Sometimes cough is productive for white mucous.  Denies any trouble with breathing. OTC Mucinex and Robitussin taken with some relief. Alb inhaler provided some relief.

## 2020-03-02 ENCOUNTER — Ambulatory Visit
Admission: EM | Admit: 2020-03-02 | Discharge: 2020-03-02 | Disposition: A | Discharge status: Home / Self Care | Payer: Commercial Managed Care - PPO | Attending: Family Medicine | Admitting: Family Medicine

## 2020-03-02 ENCOUNTER — Other Ambulatory Visit: Payer: Self-pay

## 2020-03-02 DIAGNOSIS — N410 Acute prostatitis: Secondary | ICD-10-CM | POA: Diagnosis present

## 2020-03-02 LAB — URINALYSIS, COMPLETE (UACMP) WITH MICROSCOPIC
Bilirubin Urine: NEGATIVE
Glucose, UA: >1000 mg/dL — AB
Hgb urine dipstick: NEGATIVE
Leukocytes,Ua: NEGATIVE
Nitrite: NEGATIVE
Protein, ur: NEGATIVE mg/dL
RBC / HPF: NONE SEEN RBC/hpf (ref 0–5)
Specific Gravity, Urine: 1.025 (ref 1.005–1.030)
pH: 5.5 (ref 5.0–8.0)

## 2020-03-02 LAB — CBG MONITORING, ED: Glucose, fingerstick: 278 — AB (ref 70–99)

## 2020-03-02 LAB — GLUCOSE, CAPILLARY: Glucose-Capillary: 278 mg/dL — ABNORMAL HIGH (ref 70–99)

## 2020-03-02 MED ORDER — SULFAMETHOXAZOLE-TRIMETHOPRIM 800-160 MG PO TABS: 1.0000 | ORAL_TABLET | Freq: Two times a day (BID) | ORAL | 0 refills | Status: AC

## 2020-03-02 NOTE — ED Provider Notes (Signed)
[facility]    CSN: [id number] Arrival date & time: 03/02/20  0953      History   Chief Complaint Chief Complaint  Patient presents with  . Back Pain    HPI Alvin Ward is a 55 y.o. male.   HPI   55 year old male here for evaluation of low back pain that radiates into his groin, pain with urination, and nausea.  Patient also reports that he has had associated symptoms of decreased urine stream, urine dribbling, incontinence, and urinary frequency.  Patient denies fatigue, chills, or hematuria.  Patient reports that the back pain with radiation to his groin started last night but the incontinence, dribbling, and decreased stream has been going on for a while.  Past Medical History:  Diagnosis Date  . Diabetes mellitus without complication ([place])   . Skin cancer   . Sleep apnea     Patient Active Problem List   Diagnosis Date Noted  . Cough 03/06/2017    Past Surgical History:  Procedure Laterality Date  . NASAL SINUS SURGERY         Home Medications    Prior to Admission medications   Medication Sig Start Date End Date Taking? Authorizing Provider  albuterol (PROVENTIL HFA;VENTOLIN HFA) 108 (90 Base) MCG/ACT inhaler Inhale into the lungs. Inhale 1-2 puffs every 4 hours as needed 05/01/17 03/02/20 Yes [provider]  aspirin 81 MG chewable tablet Chew by mouth daily.   Yes [provider]  atorvastatin (LIPITOR) 10 MG tablet Take 10 mg by mouth daily.   Yes [provider]  cetirizine (ZYRTEC) 10 MG tablet Take 10 mg by mouth daily.   Yes [provider]  famotidine (PEPCID) 20 MG tablet Take 1 tablet (20 mg total) by mouth 2 (two) times daily. 10/09/16 01/10/18 Yes Darel Hong, MD  Lido-PE-Glycerin-Petrolatum (PREPARATION H RAPID RELIEF) 5-0.25-14.4-15 % CREA Place 1 application rectally 3 (three) times daily as needed (hemorrhoidal pain). 01/10/18  Yes Lisa Roca, MD  metFORMIN (GLUCOPHAGE) 500 MG tablet Take  500 mg by mouth 2 (two) times daily with a meal.   Yes [provider]  omeprazole (PRILOSEC) 20 MG capsule Take 20 mg by mouth daily.   Yes [provider]  sulfamethoxazole-trimethoprim (BACTRIM DS) 800-160 MG tablet Take 1 tablet by mouth 2 (two) times daily for 14 days. 03/02/20 03/16/20 Yes Margarette Canada, NP  witch hazel-glycerin (TUCKS) pad Apply 1 application topically as needed for itching. 01/10/18  Yes Lisa Roca, MD    Family History Family History  Problem Relation Age of Onset  . Diabetes Mother   . Diabetes Father     Social History Social History   Tobacco Use  . Smoking status: Never Smoker  . Smokeless tobacco: Never Used  Vaping Use  . Vaping Use: Never used  Substance Use Topics  . Alcohol use: No  . Drug use: No     Allergies   Ace inhibitors, Banana, Latex, and Pecan pollen   Review of Systems Review of Systems  Constitutional: Negative for activity change, appetite change and fever.  Genitourinary: Positive for decreased urine volume, difficulty urinating, dysuria and frequency. Negative for hematuria, penile discharge, penile pain, penile swelling, scrotal swelling, testicular pain and urgency.  Musculoskeletal: Positive for back pain.  Hematological: Negative.   Psychiatric/Behavioral: Negative.      Physical Exam Triage Vital Signs ED Triage Vitals  Enc Vitals Group     BP 03/02/20 1027 (!) 145/94     Pulse  Rate 03/02/20 1027 89     Resp 03/02/20 1027 18     Temp 03/02/20 1027 98.6 F (37 C)     Temp Source 03/02/20 1027 Oral     SpO2 03/02/20 1027 97 %     Weight 03/02/20 1024 (!) 340 lb (154.2 kg)     Height 03/02/20 1024 5\' 11"  (1.803 m)     Head Circumference --      Peak Flow --      Pain Score 03/02/20 1024 7     Pain Loc --      Pain Edu? --      Excl. in [place]? --    No data found.  Updated Vital Signs BP (!) 145/94 (BP Location: Left Arm)   Pulse 89   Temp 98.6 F (37 C) (Oral)   Resp 18   Ht 5\' 11"   (1.803 m)   Wt (!) 340 lb (154.2 kg)   SpO2 97%   BMI 47.42 kg/m   Visual Acuity Right Eye Distance:   Left Eye Distance:   Bilateral Distance:    Right Eye Near:   Left Eye Near:    Bilateral Near:     Physical Exam Vitals and nursing note reviewed.  Constitutional:      General: He is not in acute distress.    Appearance: Normal appearance. He is normal weight. He is not toxic-appearing.  HENT:     Head: Normocephalic and atraumatic.  Cardiovascular:     Rate and Rhythm: Normal rate and regular rhythm.     Pulses: Normal pulses.     Heart sounds: Normal heart sounds. No murmur heard. No gallop.   Pulmonary:     Effort: Pulmonary effort is normal.     Breath sounds: Normal breath sounds. No wheezing or rales.  Abdominal:     Tenderness: There is no right CVA tenderness or left CVA tenderness.  Skin:    General: Skin is warm and dry.     Capillary Refill: Capillary refill takes less than 2 seconds.     Findings: No erythema or rash.  Neurological:     General: No focal deficit present.     Mental Status: He is alert and oriented to person, place, and time.  Psychiatric:        Mood and Affect: Mood normal.        Behavior: Behavior normal.        Thought Content: Thought content normal.        Judgment: Judgment normal.      UC Treatments / Results  Labs (all labs ordered are listed, but only abnormal results are displayed) Labs Reviewed  URINALYSIS, COMPLETE (UACMP) WITH MICROSCOPIC - Abnormal; Notable for the following components:      Result Value   Glucose, UA >1,000 (*)    Ketones, ur TRACE (*)    Bacteria, UA FEW (*)    All other components within normal limits  GLUCOSE, CAPILLARY - Abnormal; Notable for the following components:   Glucose-Capillary 278 (*)    All other components within normal limits  CBG MONITORING, ED - Abnormal; Notable for the following components:   Glucose, fingerstick 278 (*)    All other components within normal limits   URINE CULTURE    EKG   Radiology No results found.  Procedures Procedures (including critical care time)  Medications Ordered in UC Medications - No data to display  Initial Impression / Assessment and Plan / UC Course  I have reviewed the triage vital signs and the nursing notes.  Pertinent labs & imaging results that were available during my care of the patient were reviewed by me and considered in my medical decision making (see chart for details).   Patient is here for evaluation of genitourinary symptoms that been going on since last night.  Patient does have a history of diabetes and he is reporting that he has been dealing with a decreased urine stream, some intermittent cough since, and dribbling of urine for a little while now.  He developed low back pain, pain with urination, and radiation to his groin last night.  Patient denies fever, fatigue, chills, or hematuria.  Physical exam is largely benign.  Lungs are clear to auscultation.  No CVA tenderness on exam.  Patient is opting to defer prostate genital exam at present.  UA collected at triage and is pending.  Urinalysis shows greater than thousand glucose, trace ketones, and few bacteria with 6-10 WBCs.  No leukocytes and no nitrites.  We will send urine for culture.    Fingerstick blood sugar 278.  Patient agreed to digital rectal exam.  Patient had marked tenderness with a DRE.  We will treat patient for prostatitis with Bactrim twice daily x14 days.  Final Clinical Impressions(s) / UC Diagnoses   Final diagnoses:  Prostatitis, acute     Discharge Instructions     Take the Bactrim twice daily with a full glass of water for 14 days for treatment of your prostate infection.      ED Prescriptions    Medication Sig Dispense Auth. Provider   sulfamethoxazole-trimethoprim (BACTRIM DS) 800-160 MG tablet Take 1 tablet by mouth 2 (two) times daily for 14 days. 28 tablet Margarette Canada, NP     PDMP not reviewed  this encounter.   Margarette Canada, NP 03/02/20 [phone]

## 2020-03-02 NOTE — Discharge Instructions (Addendum)
Take the Bactrim twice daily with a full glass of water for 14 days for treatment of your prostate infection.

## 2020-03-02 NOTE — ED Triage Notes (Signed)
Pt c/o lower back pain radiating into his groin area. Pt denies any numbness/tingling. Pt does report some "stinging" with urination. Pt denies any hematuria. Pt states pain makes it difficult to sleep/get comfortable. Pt also reports some nausea, denies f/v/d or other symptoms.

## 2020-03-03 LAB — URINE CULTURE
Culture: <10000 — AB
Special Requests: NORMAL

## 2021-05-13 IMAGING — CR DG CHEST 2V
1 series · 2 of 2 positions shown · non-contrast
Comparison: 03/06/2017

CLINICAL DATA: Chest pain

EXAM:
CHEST - 2 VIEW

[Series 1: dg chest 2 view · 0.14mm/px · 2 of 2 slices shown]
[im 1/2]
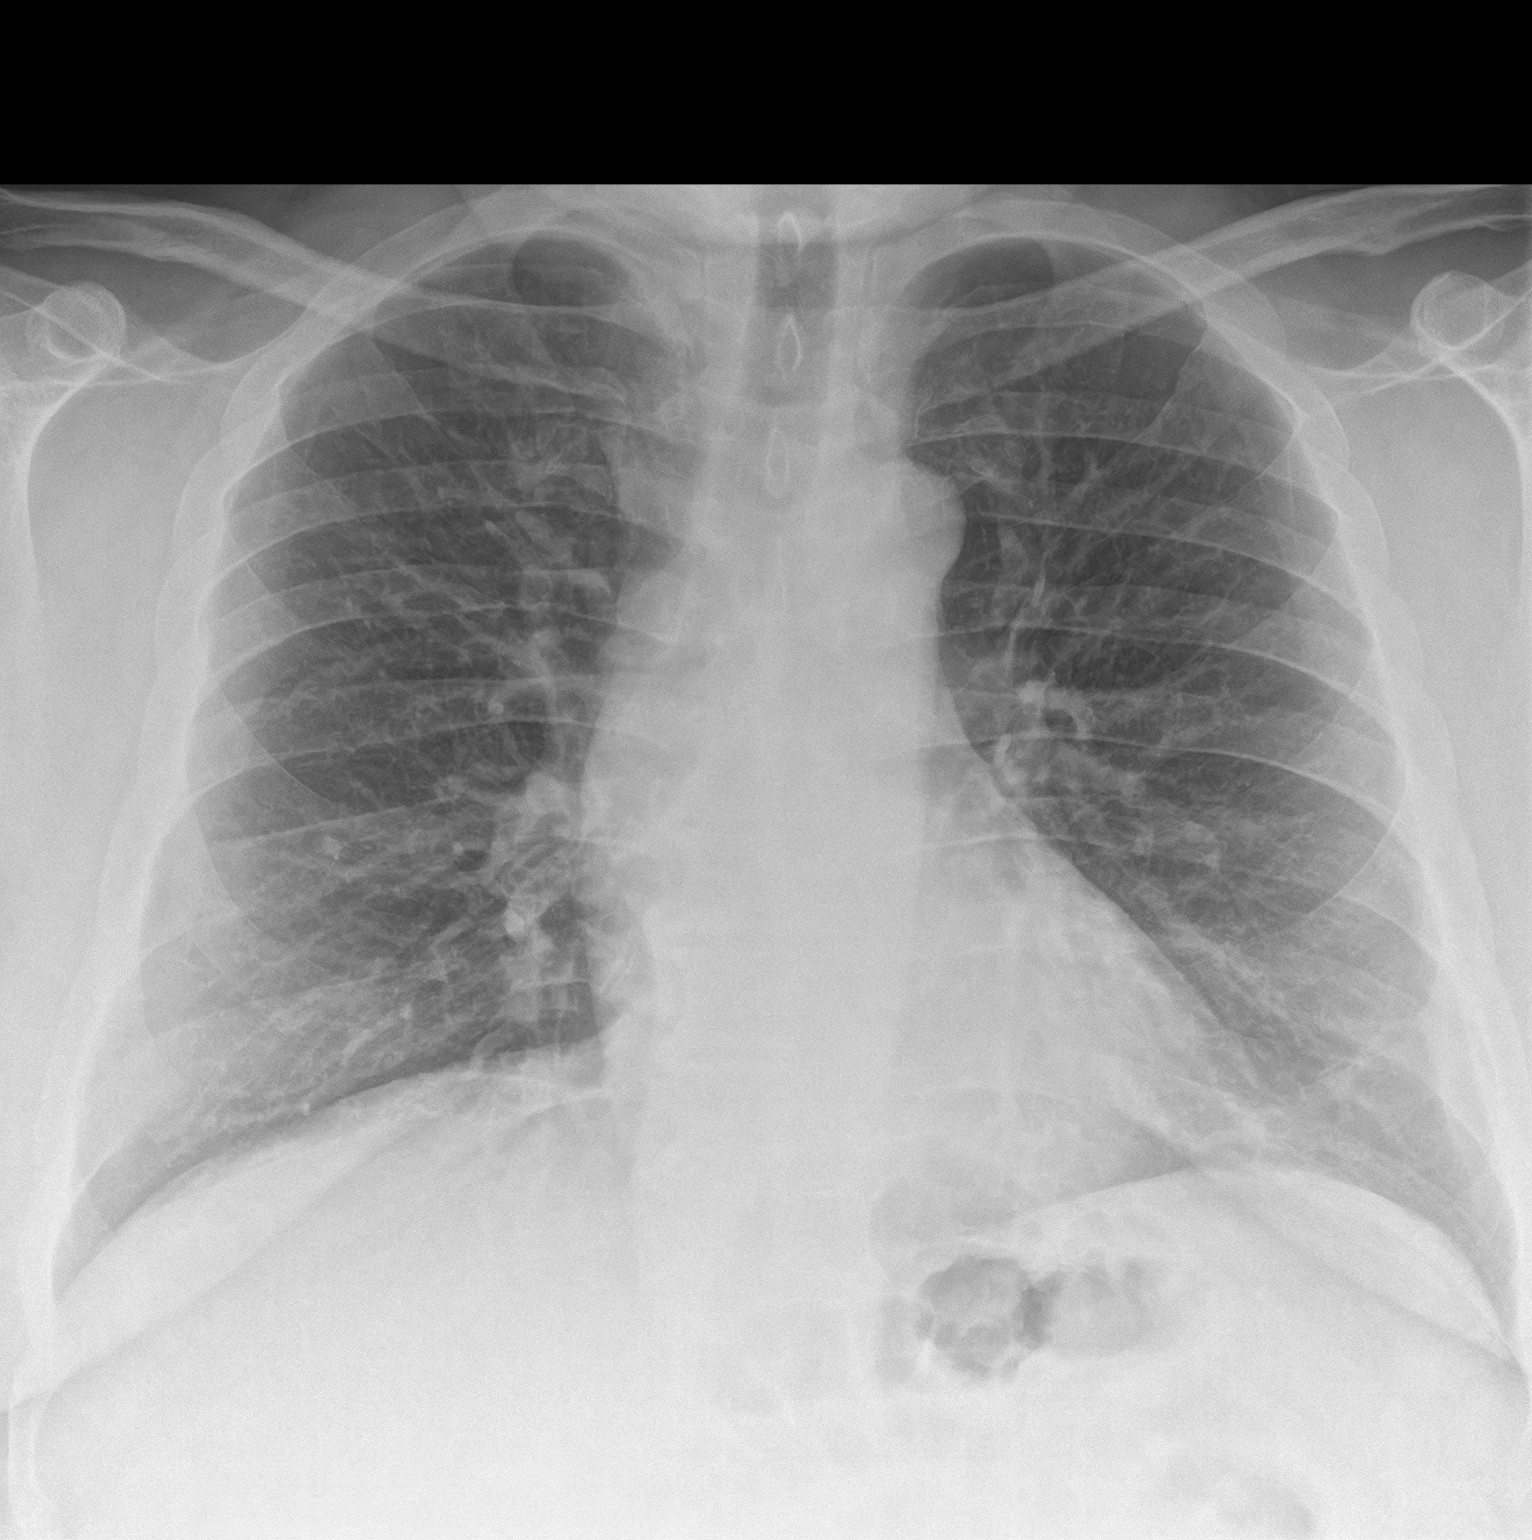
[im 2/2]
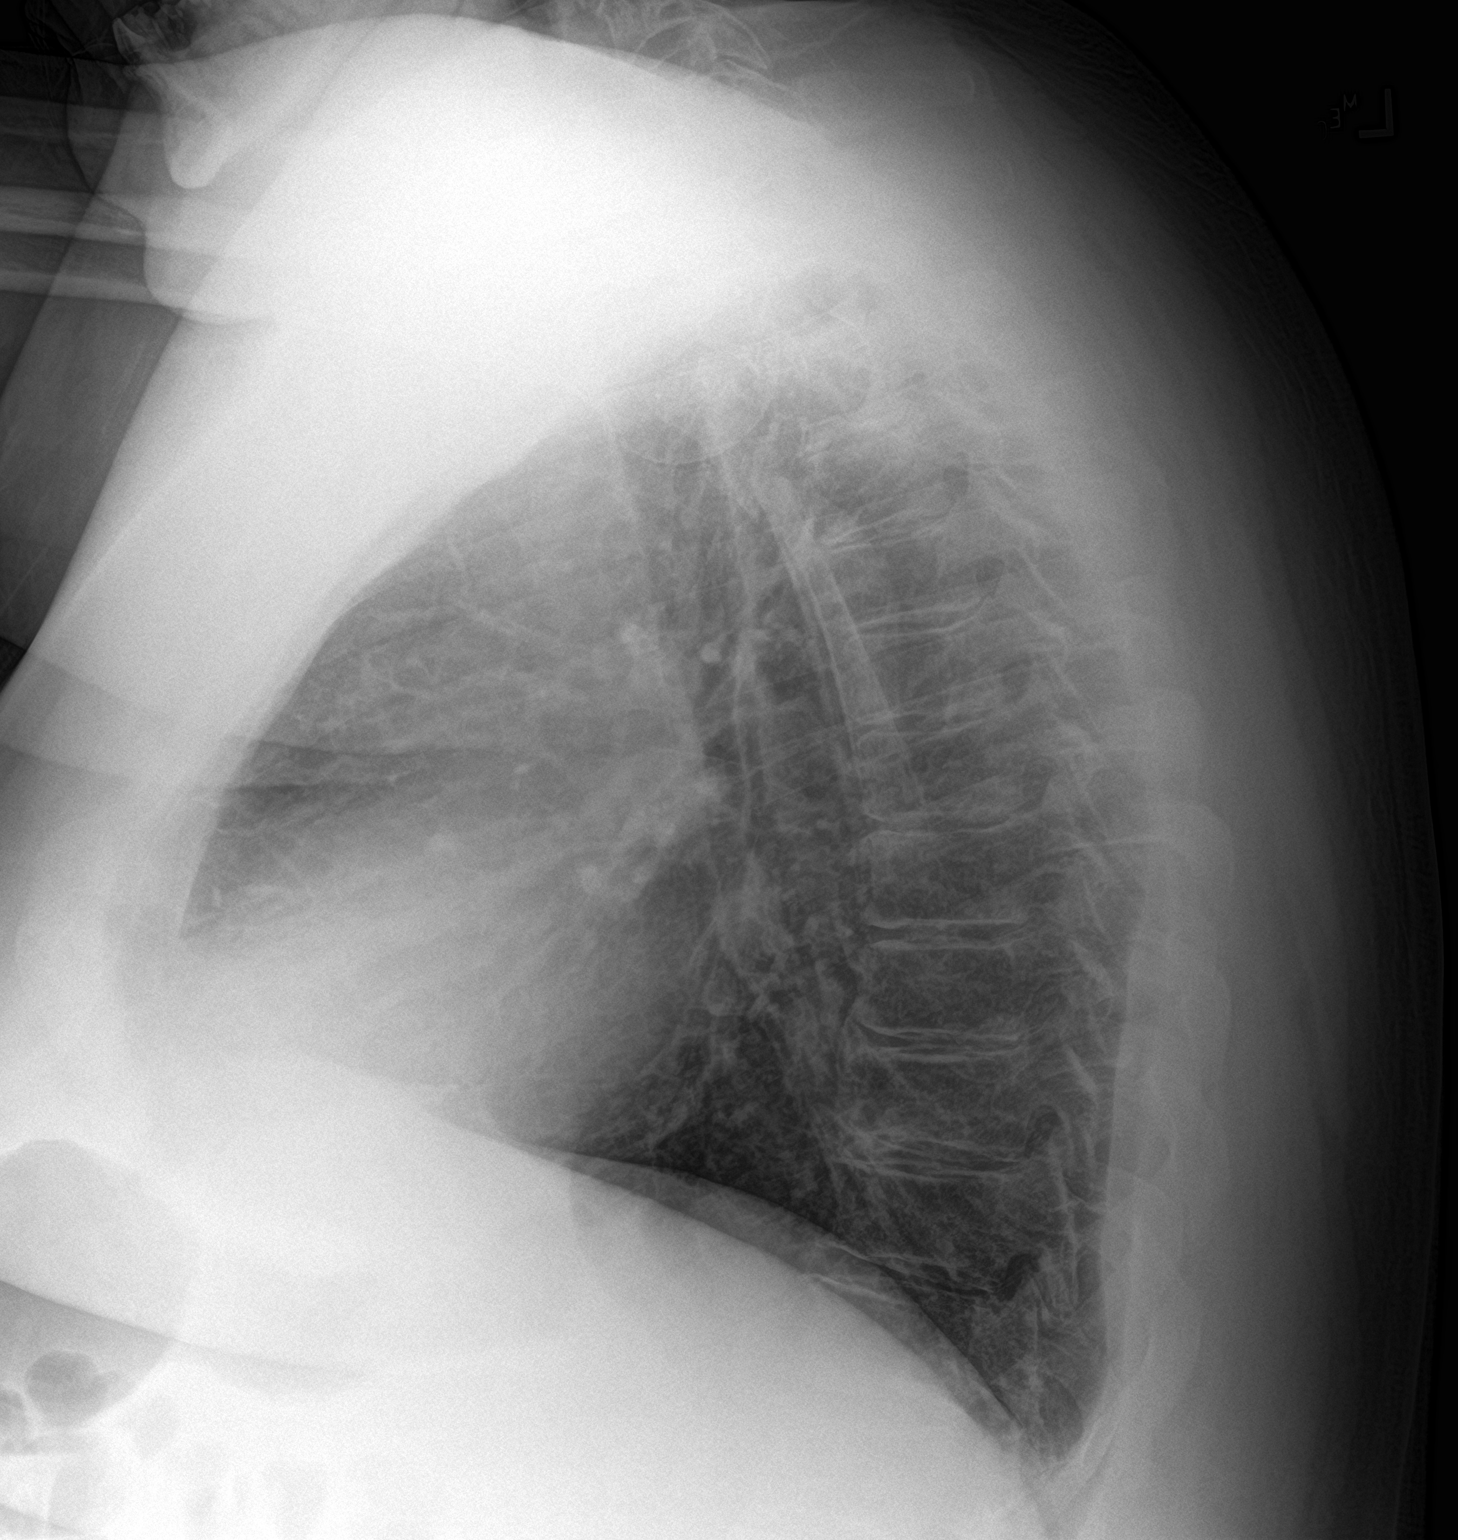

[2 of 2 positions shown; findings below may reference images not displayed]

FINDINGS: Heart and mediastinal contours are within normal limits. No focal
opacities or effusions. No acute bony abnormality.
IMPRESSION: No active cardiopulmonary disease.

## 2021-10-03 ENCOUNTER — Ambulatory Visit
Admission: EM | Admit: 2021-10-03 | Discharge: 2021-10-03 | Disposition: A | Discharge status: Home / Self Care | Payer: Commercial Managed Care - PPO | Attending: Physician Assistant | Admitting: Physician Assistant

## 2021-10-03 DIAGNOSIS — Z1152 Encounter for screening for COVID-19: Secondary | ICD-10-CM | POA: Insufficient documentation

## 2021-10-03 DIAGNOSIS — J069 Acute upper respiratory infection, unspecified: Secondary | ICD-10-CM | POA: Diagnosis present

## 2021-10-03 LAB — SARS CORONAVIRUS 2 BY RT PCR: SARS Coronavirus 2 by RT PCR: NEGATIVE

## 2021-10-03 MED ORDER — PROMETHAZINE-DM 6.25-15 MG/5ML PO SYRP: 5.0000 mL | ORAL_SOLUTION | Freq: Four times a day (QID) | ORAL | 0 refills | Status: DC | PRN

## 2021-10-03 NOTE — ED Triage Notes (Signed)
Pt c/o cough, drainage back of throat onset Monday

## 2021-10-03 NOTE — ED Provider Notes (Signed)
[facility]    CSN: [id number] Arrival date & time: 10/03/21  4742      History   Chief Complaint Chief Complaint  Patient presents with   Cough    HPI Alvin Ward is a 56 y.o. male presenting for 2-day history of cough and postnasal drainage.  Denies fever, fatigue, aches, breathing difficulty , vomiting or diarrhea.  Denies known COVID exposure or sick contacts.  Has been taking OTC meds for symptoms.  History of diabetes and obesity.  HPI  Past Medical History:  Diagnosis Date   Diabetes mellitus without complication ([place])    Skin cancer    Sleep apnea     Patient Active Problem List   Diagnosis Date Noted   Cough 03/06/2017    Past Surgical History:  Procedure Laterality Date   NASAL SINUS SURGERY         Home Medications    Prior to Admission medications   Medication Sig Start Date End Date Taking? Authorizing Provider  albuterol (PROVENTIL HFA;VENTOLIN HFA) 108 (90 Base) MCG/ACT inhaler Inhale into the lungs. Inhale 1-2 puffs every 4 hours as needed 05/01/17 10/03/21 Yes [provider]  aspirin 81 MG chewable tablet Chew by mouth daily.   Yes [provider]  atorvastatin (LIPITOR) 10 MG tablet Take 10 mg by mouth daily.   Yes [provider]  cetirizine (ZYRTEC) 10 MG tablet Take 10 mg by mouth daily.   Yes [provider]  famotidine (PEPCID) 20 MG tablet Take 1 tablet (20 mg total) by mouth 2 (two) times daily. 10/09/16 10/03/21 Yes Darel Hong, MD  metFORMIN (GLUCOPHAGE) 500 MG tablet Take 500 mg by mouth 2 (two) times daily with a meal.   Yes [provider]  omeprazole (PRILOSEC) 20 MG capsule Take 20 mg by mouth daily.   Yes [provider]  promethazine-dextromethorphan (PROMETHAZINE-DM) 6.25-15 MG/5ML syrup Take 5 mLs by mouth 4 (four) times daily as needed. 10/03/21  Yes Laurene Footman B, PA-C  fluticasone (FLONASE) 50 MCG/ACT nasal spray Place 2 sprays into both nostrils daily.  04/15/21   [provider]  glipiZIDE (GLUCOTROL XL) 10 MG 24 hr tablet Take 10 mg by mouth daily. 10/02/21   [provider]  Lido-PE-Glycerin-Petrolatum (PREPARATION H RAPID RELIEF) 5-0.25-14.4-15 % CREA Place 1 application rectally 3 (three) times daily as needed (hemorrhoidal pain). 01/10/18   Lisa Roca, MD  witch hazel-glycerin (TUCKS) pad Apply 1 application topically as needed for itching. 01/10/18   Lisa Roca, MD    Family History Family History  Problem Relation Age of Onset   Diabetes Mother    Diabetes Father     Social History Social History   Tobacco Use   Smoking status: Never   Smokeless tobacco: Never  Vaping Use   Vaping Use: Never used  Substance Use Topics   Alcohol use: No   Drug use: No     Allergies   Ace inhibitors, Banana, Latex, and Pecan pollen   Review of Systems Review of Systems  Constitutional:  Negative for fatigue and fever.  HENT:  Positive for congestion, postnasal drip and rhinorrhea. Negative for sinus pressure, sinus pain and sore throat.   Respiratory:  Positive for cough. Negative for shortness of breath.   Gastrointestinal:  Negative for abdominal pain, diarrhea, nausea and vomiting.  Musculoskeletal:  Negative for myalgias.  Neurological:  Negative for weakness, light-headedness and headaches.  Hematological:  Negative for adenopathy.     Physical Exam Triage Vital  Signs ED Triage Vitals  Enc Vitals Group     BP      Pulse      Resp      Temp      Temp src      SpO2      Weight      Height      Head Circumference      Peak Flow      Pain Score      Pain Loc      Pain Edu?      Excl. in [place]?    No data found.  Updated Vital Signs BP (!) 142/85 (BP Location: Left Arm)   Pulse 76   Temp 98.2 F (36.8 C) (Oral)   Ht '5\' 10"'$  (1.778 m)   Wt (!) 350 lb (158.8 kg)   SpO2 97%   BMI 50.22 kg/m      Physical Exam Vitals and nursing note reviewed.  Constitutional:      General: He is not  in acute distress.    Appearance: Normal appearance. He is well-developed. He is obese. He is not ill-appearing.  HENT:     Head: Normocephalic and atraumatic.     Nose: Congestion present.     Mouth/Throat:     Mouth: Mucous membranes are moist.     Pharynx: Oropharynx is clear. Posterior oropharyngeal erythema present.  Eyes:     General: No scleral icterus.    Conjunctiva/sclera: Conjunctivae normal.  Cardiovascular:     Rate and Rhythm: Normal rate and regular rhythm.     Heart sounds: Normal heart sounds.  Pulmonary:     Effort: Pulmonary effort is normal. No respiratory distress.     Breath sounds: Normal breath sounds.  Musculoskeletal:     Cervical back: Neck supple.  Skin:    General: Skin is warm and dry.     Capillary Refill: Capillary refill takes less than 2 seconds.  Neurological:     General: No focal deficit present.     Mental Status: He is alert. Mental status is at baseline.     Motor: No weakness.     Gait: Gait normal.  Psychiatric:        Mood and Affect: Mood normal.        Behavior: Behavior normal.      UC Treatments / Results  Labs (all labs ordered are listed, but only abnormal results are displayed) Labs Reviewed  SARS CORONAVIRUS 2 BY RT PCR    EKG   Radiology No results found.  Procedures Procedures (including critical care time)  Medications Ordered in UC Medications - No data to display  Initial Impression / Assessment and Plan / UC Course  I have reviewed the triage vital signs and the nursing notes.  Pertinent labs & imaging results that were available during my care of the patient were reviewed by me and considered in my medical decision making (see chart for details).   56 year old male with diabetes presents for cough, ingestion postnasal drainage that started 2 days ago.  Denies fever or breathing difficulty.  No known COVID exposure but is agreeable to testing.  He is afebrile and overall well-appearing.  On exam he  has nasal congestion, postnasal drainage and erythema posterior pharynx.  Chest clear to auscultation heart regular rate and rhythm.  PCR COVID test obtained.  Advised patient we will contact him if it is positive.  Reviewed current CDC guidelines, isolation protocol and ED precautions if positive.  Advised we will not contact him if the COVID is negative.  He likely has another virus if he is not contacted by [place].  His care is supportive.  I sent cough medicine and encouraged him to increase his rest and fluids and follow-up as needed.  Negative COVID.   Final Clinical Impressions(s) / UC Diagnoses   Final diagnoses:  Viral URI with cough  Encounter for screening for COVID-19     Discharge Instructions      -We will contact you if your COVID test is positive.  If it is positive you need to isolate 5 days and wear a mask for 5 days.  Tuesday would likely be your first full day of isolation. - I can send antiviral medication if you are positive for COVID. - If your test is negative, you likely have another virus which should run its course within 7 to 10 days.  Care is supportive.  I have sent cough medicine.  Increase rest and fluids. - Return for any acute worsening symptoms.     ED Prescriptions     Medication Sig Dispense Auth. Provider   promethazine-dextromethorphan (PROMETHAZINE-DM) 6.25-15 MG/5ML syrup Take 5 mLs by mouth 4 (four) times daily as needed. 118 mL Danton Clap, PA-C      PDMP not reviewed this encounter.   Danton Clap, PA-C 10/03/21 1049

## 2021-10-03 NOTE — Discharge Instructions (Signed)
-  We will contact you if your COVID test is positive.  If it is positive you need to isolate 5 days and wear a mask for 5 days.  Tuesday would likely be your first full day of isolation. - I can send antiviral medication if you are positive for COVID. - If your test is negative, you likely have another virus which should run its course within 7 to 10 days.  Care is supportive.  I have sent cough medicine.  Increase rest and fluids. - Return for any acute worsening symptoms.

## 2021-10-07 ENCOUNTER — Ambulatory Visit
Admission: EM | Admit: 2021-10-07 | Discharge: 2021-10-07 | Disposition: A | Discharge status: Home / Self Care | Payer: Commercial Managed Care - PPO | Attending: Emergency Medicine | Admitting: Emergency Medicine

## 2021-10-07 ENCOUNTER — Encounter: Payer: Self-pay | Admitting: Emergency Medicine

## 2021-10-07 DIAGNOSIS — J0100 Acute maxillary sinusitis, unspecified: Secondary | ICD-10-CM

## 2021-10-07 MED ORDER — BENZONATATE 100 MG PO CAPS: 200.0000 mg | ORAL_CAPSULE | Freq: Three times a day (TID) | ORAL | 0 refills | Status: DC

## 2021-10-07 MED ORDER — IPRATROPIUM BROMIDE 0.06 % NA SOLN: 2.0000 | Freq: Four times a day (QID) | NASAL | 12 refills | Status: DC

## 2021-10-07 MED ORDER — AMOXICILLIN-POT CLAVULANATE 875-125 MG PO TABS: 1.0000 | ORAL_TABLET | Freq: Two times a day (BID) | ORAL | 0 refills | Status: AC

## 2021-10-07 MED ORDER — PROMETHAZINE-DM 6.25-15 MG/5ML PO SYRP: 5.0000 mL | ORAL_SOLUTION | Freq: Four times a day (QID) | ORAL | 0 refills | Status: DC | PRN

## 2021-10-07 NOTE — ED Provider Notes (Signed)
[facility]    CSN: [id number] Arrival date & time: 10/07/21  1122      History   Chief Complaint Chief Complaint  Patient presents with   Cough    HPI Alvin Ward is a 56 y.o. male.   HPI  56 year old male here for evaluation of respiratory complaints.  Patient was evaluated in this urgent care 4 days ago for 2-day history of postnasal drip and cough.  In the ensuing interval he states he has developed pain in his left maxillary sinus, thick yellow nasal discharge, and continues to have a nonproductive cough though he states he does hear some crackling when he coughs.  He denies fever, sore throat, ear pain, shortness of breath, or wheezing.  Past Medical History:  Diagnosis Date   Diabetes mellitus without complication ([place])    Skin cancer    Sleep apnea     Patient Active Problem List   Diagnosis Date Noted   Cough 03/06/2017    Past Surgical History:  Procedure Laterality Date   NASAL SINUS SURGERY         Home Medications    Prior to Admission medications   Medication Sig Start Date End Date Taking? Authorizing Provider  amoxicillin-clavulanate (AUGMENTIN) 875-125 MG tablet Take 1 tablet by mouth every 12 (twelve) hours for 10 days. 10/07/21 10/17/21 Yes Margarette Canada, NP  benzonatate (TESSALON) 100 MG capsule Take 2 capsules (200 mg total) by mouth every 8 (eight) hours. 10/07/21  Yes Margarette Canada, NP  ipratropium (ATROVENT) 0.06 % nasal spray Place 2 sprays into both nostrils 4 (four) times daily. 10/07/21  Yes Margarette Canada, NP  promethazine-dextromethorphan (PROMETHAZINE-DM) 6.25-15 MG/5ML syrup Take 5 mLs by mouth 4 (four) times daily as needed. 10/07/21  Yes Margarette Canada, NP  albuterol (PROVENTIL HFA;VENTOLIN HFA) 108 (90 Base) MCG/ACT inhaler Inhale into the lungs. Inhale 1-2 puffs every 4 hours as needed 05/01/17 10/03/21  [provider]  aspirin 81 MG chewable tablet Chew by mouth daily.    [provider]  atorvastatin  (LIPITOR) 10 MG tablet Take 10 mg by mouth daily.    [provider]  cetirizine (ZYRTEC) 10 MG tablet Take 10 mg by mouth daily.    [provider]  famotidine (PEPCID) 20 MG tablet Take 1 tablet (20 mg total) by mouth 2 (two) times daily. 10/09/16 10/03/21  Darel Hong, MD  fluticasone (FLONASE) 50 MCG/ACT nasal spray Place 2 sprays into both nostrils daily. 04/15/21   [provider]  glipiZIDE (GLUCOTROL XL) 10 MG 24 hr tablet Take 10 mg by mouth daily. 10/02/21   [provider]  Lido-PE-Glycerin-Petrolatum (PREPARATION H RAPID RELIEF) 5-0.25-14.4-15 % CREA Place 1 application rectally 3 (three) times daily as needed (hemorrhoidal pain). 01/10/18   Lisa Roca, MD  metFORMIN (GLUCOPHAGE) 500 MG tablet Take 500 mg by mouth 2 (two) times daily with a meal.    [provider]  omeprazole (PRILOSEC) 20 MG capsule Take 20 mg by mouth daily.    [provider]  witch hazel-glycerin (TUCKS) pad Apply 1 application topically as needed for itching. 01/10/18   Lisa Roca, MD    Family History Family History  Problem Relation Age of Onset   Diabetes Mother    Diabetes Father     Social History Social History   Tobacco Use   Smoking status: Never   Smokeless tobacco: Never  Vaping Use   Vaping Use: Never used  Substance Use Topics   Alcohol use:  No   Drug use: No     Allergies   Ace inhibitors, Banana, Latex, and Pecan pollen   Review of Systems Review of Systems  Constitutional:  Negative for fever.  HENT:  Positive for congestion, rhinorrhea and sinus pain. Negative for ear pain and sore throat.   Respiratory:  Positive for cough. Negative for shortness of breath and wheezing.      Physical Exam Triage Vital Signs ED Triage Vitals  Enc Vitals Group     BP 10/07/21 1137 (!) 162/96     Pulse Rate 10/07/21 1137 89     Resp 10/07/21 1137 16     Temp 10/07/21 1137 98.3 F (36.8 C)     Temp Source 10/07/21 1137 Oral      SpO2 10/07/21 1137 96 %     Weight 10/07/21 1136 (!) 350 lb 1.5 oz (158.8 kg)     Height --      Head Circumference --      Peak Flow --      Pain Score 10/07/21 1135 5     Pain Loc --      Pain Edu? --      Excl. in [place]? --    No data found.  Updated Vital Signs BP (!) 162/96 (BP Location: Left Arm)   Pulse 89   Temp 98.3 F (36.8 C) (Oral)   Resp 16   Wt (!) 350 lb 1.5 oz (158.8 kg)   SpO2 96%   BMI 50.23 kg/m   Visual Acuity Right Eye Distance:   Left Eye Distance:   Bilateral Distance:    Right Eye Near:   Left Eye Near:    Bilateral Near:     Physical Exam Vitals and nursing note reviewed.  Constitutional:      Appearance: Normal appearance. He is not ill-appearing.  HENT:     Head: Normocephalic and atraumatic.     Right Ear: Tympanic membrane, ear canal and external ear normal. There is no impacted cerumen.     Left Ear: Tympanic membrane, ear canal and external ear normal. There is no impacted cerumen.     Nose: Congestion and rhinorrhea present.     Mouth/Throat:     Mouth: Mucous membranes are moist.     Pharynx: Oropharynx is clear. Posterior oropharyngeal erythema present. No oropharyngeal exudate.  Cardiovascular:     Rate and Rhythm: Normal rate and regular rhythm.     Pulses: Normal pulses.     Heart sounds: Normal heart sounds. No murmur heard.    No friction rub. No gallop.  Pulmonary:     Effort: Pulmonary effort is normal.     Breath sounds: Normal breath sounds. No wheezing, rhonchi or rales.  Musculoskeletal:     Cervical back: Normal range of motion and neck supple.  Lymphadenopathy:     Cervical: No cervical adenopathy.  Skin:    General: Skin is warm and dry.     Capillary Refill: Capillary refill takes less than 2 seconds.     Findings: No erythema or rash.  Neurological:     General: No focal deficit present.     Mental Status: He is alert and oriented to person, place, and time.  Psychiatric:        Mood and Affect: Mood  normal.        Behavior: Behavior normal.        Thought Content: Thought content normal.        Judgment: Judgment  normal.      UC Treatments / Results  Labs (all labs ordered are listed, but only abnormal results are displayed) Labs Reviewed - No data to display  EKG   Radiology No results found.  Procedures Procedures (including critical care time)  Medications Ordered in UC Medications - No data to display  Initial Impression / Assessment and Plan / UC Course  I have reviewed the triage vital signs and the nursing notes.  Pertinent labs & imaging results that were available during my care of the patient were reviewed by me and considered in my medical decision making (see chart for details).   Patient is a nontoxic-appearing 56 year old male here for evaluation of worsening upper respiratory symptoms as outlined in HPI above.  On exam patient has moderately ceruminous external auditory canals but able to visualize the anterior portion of both tympanic membranes which are pearly gray in appearance.  His nasal mucosa is markedly edematous and erythematous with thick purulent discharge in both nares.  Patient does have tenderness to percussion of the left maxillary sinus but not the right.  Frontal sinuses are benign.  Oropharyngeal exam reveals posterior oropharyngeal erythema with yellow postnasal drip.  No anterior cervical lymphadenopathy appreciable exam.  Cardiopulmonary exam reveals clear lung sounds in all fields.  I will treat the patient for macular sinusitis with Augmentin twice daily for 10 days.  I will also prescribe Atrovent nasal spray to help with his congestion, Tessalon Perles to use during the day for cough, and Promethazine DM cough syrup he can use for cough at bedtime.  We discussed increasing his fluid intake to thin his mucus out, sinus irrigation, and use of Mucinex to help break up the tenacity of his mucus.  Return precautions reviewed.   Final Clinical  Impressions(s) / UC Diagnoses   Final diagnoses:  Acute non-recurrent maxillary sinusitis     Discharge Instructions      The Augmentin twice daily with food for 10 days for treatment of your sinusitis.  Perform sinus irrigation 2-3 times a day with a [employer] sinus rinse kit and distilled water.  Do not use tap water.  You can use plain over-the-counter Mucinex every 6 hours to break up the stickiness of the mucus so your body can clear it.  Increase your oral fluid intake to thin out your mucus so that is also able for your body to clear more easily.  Take an over-the-counter probiotic, such as Culturelle-align-activia, 1 hour after each dose of antibiotic to prevent diarrhea.  Use the Atrovent nasal spray, 2 squirts in each nostril every 6 hours, as needed for runny nose and postnasal drip.  Use the Tessalon Perles every 8 hours during the day.  Take them with a small sip of water.  They may give you some numbness to the base of your tongue or a metallic taste in your mouth, this is normal.  Use the Promethazine DM cough syrup at bedtime for cough and congestion.  It will make you drowsy so do not take it during the day.  If you develop any new or worsening symptoms return for reevaluation or see your primary care provider.      ED Prescriptions     Medication Sig Dispense Auth. Provider   amoxicillin-clavulanate (AUGMENTIN) 875-125 MG tablet Take 1 tablet by mouth every 12 (twelve) hours for 10 days. 20 tablet Margarette Canada, NP   ipratropium (ATROVENT) 0.06 % nasal spray Place 2 sprays into both nostrils 4 (four) times  daily. 15 mL Margarette Canada, NP   benzonatate (TESSALON) 100 MG capsule Take 2 capsules (200 mg total) by mouth every 8 (eight) hours. 21 capsule Margarette Canada, NP   promethazine-dextromethorphan (PROMETHAZINE-DM) 6.25-15 MG/5ML syrup Take 5 mLs by mouth 4 (four) times daily as needed. 118 mL Margarette Canada, NP      PDMP not reviewed this encounter.   Margarette Canada, NP 10/07/21 1226

## 2021-10-07 NOTE — Discharge Instructions (Signed)
The Augmentin twice daily with food for 10 days for treatment of your sinusitis.  Perform sinus irrigation 2-3 times a day with a [employer] sinus rinse kit and distilled water.  Do not use tap water.  You can use plain over-the-counter Mucinex every 6 hours to break up the stickiness of the mucus so your body can clear it.  Increase your oral fluid intake to thin out your mucus so that is also able for your body to clear more easily.  Take an over-the-counter probiotic, such as Culturelle-align-activia, 1 hour after each dose of antibiotic to prevent diarrhea.  Use the Atrovent nasal spray, 2 squirts in each nostril every 6 hours, as needed for runny nose and postnasal drip.  Use the Tessalon Perles every 8 hours during the day.  Take them with a small sip of water.  They may give you some numbness to the base of your tongue or a metallic taste in your mouth, this is normal.  Use the Promethazine DM cough syrup at bedtime for cough and congestion.  It will make you drowsy so do not take it during the day.  If you develop any new or worsening symptoms return for reevaluation or see your primary care provider.  

## 2021-10-07 NOTE — ED Triage Notes (Signed)
Patient was seen here on 10/03/21.  Patient is back here because his cough has gotten worse and is having more sinus pain and pressure.  Patient denies fevers.

## 2021-11-29 ENCOUNTER — Ambulatory Visit
Admission: EM | Admit: 2021-11-29 | Discharge: 2021-11-29 | Disposition: A | Discharge status: Home / Self Care | Payer: Commercial Managed Care - PPO | Attending: Physician Assistant | Admitting: Physician Assistant

## 2021-11-29 ENCOUNTER — Ambulatory Visit (INDEPENDENT_AMBULATORY_CARE_PROVIDER_SITE_OTHER): Payer: Commercial Managed Care - PPO

## 2021-11-29 DIAGNOSIS — Z1152 Encounter for screening for COVID-19: Secondary | ICD-10-CM | POA: Diagnosis not present

## 2021-11-29 DIAGNOSIS — J069 Acute upper respiratory infection, unspecified: Secondary | ICD-10-CM | POA: Insufficient documentation

## 2021-11-29 DIAGNOSIS — R051 Acute cough: Secondary | ICD-10-CM | POA: Insufficient documentation

## 2021-11-29 DIAGNOSIS — R0981 Nasal congestion: Secondary | ICD-10-CM | POA: Insufficient documentation

## 2021-11-29 DIAGNOSIS — R059 Cough, unspecified: Secondary | ICD-10-CM

## 2021-11-29 LAB — SARS CORONAVIRUS 2 BY RT PCR: SARS Coronavirus 2 by RT PCR: NEGATIVE

## 2021-11-29 NOTE — ED Triage Notes (Signed)
Patient reports nasal congestion, sneezing, coughing for the past 5 days.

## 2021-11-29 NOTE — ED Provider Notes (Signed)
[facility]    CSN: [id number] Arrival date & time: 11/29/21  1658      History   Chief Complaint Chief Complaint  Patient presents with   Nasal Congestion   Cough    HPI Alvin Ward is a 56 y.o. male presenting for 5-day history of cough, nasal congestion, and postnasal drainage.  Feels like his chest is congested.  Denies fever, fatigue, aches, breathing difficulty , vomiting or diarrhea.  Denies known COVID exposure or sick contacts.  He has not tested himself at home.  Is open to testing today.  Has been taking OTC meds for symptoms.  Normally takes an antihistamine and uses Flonase.  Also been taking decongestants.  He says nothing has given him any relief.  He was treated for a sinus infection in September and says his symptoms feel similar to that.  History of diabetes, OSA, and obesity.  HPI  Past Medical History:  Diagnosis Date   Diabetes mellitus without complication ([place])    Skin cancer    Sleep apnea     Patient Active Problem List   Diagnosis Date Noted   Cough 03/06/2017    Past Surgical History:  Procedure Laterality Date   NASAL SINUS SURGERY         Home Medications    Prior to Admission medications   Medication Sig Start Date End Date Taking? Authorizing Provider  albuterol (PROVENTIL HFA;VENTOLIN HFA) 108 (90 Base) MCG/ACT inhaler Inhale into the lungs. Inhale 1-2 puffs every 4 hours as needed 05/01/17 10/03/21  [provider]  aspirin 81 MG chewable tablet Chew by mouth daily.    [provider]  atorvastatin (LIPITOR) 10 MG tablet Take 10 mg by mouth daily.    [provider]  benzonatate (TESSALON) 100 MG capsule Take 2 capsules (200 mg total) by mouth every 8 (eight) hours. 10/07/21   Margarette Canada, NP  cetirizine (ZYRTEC) 10 MG tablet Take 10 mg by mouth daily.    [provider]  famotidine (PEPCID) 20 MG tablet Take 1 tablet (20 mg total) by mouth 2 (two) times daily. 10/09/16 10/03/21   Darel Hong, MD  fluticasone (FLONASE) 50 MCG/ACT nasal spray Place 2 sprays into both nostrils daily. 04/15/21   [provider]  glipiZIDE (GLUCOTROL XL) 10 MG 24 hr tablet Take 10 mg by mouth daily. 10/02/21   [provider]  ipratropium (ATROVENT) 0.06 % nasal spray Place 2 sprays into both nostrils 4 (four) times daily. 10/07/21   Margarette Canada, NP  Lido-PE-Glycerin-Petrolatum (PREPARATION H RAPID RELIEF) 5-0.25-14.4-15 % CREA Place 1 application rectally 3 (three) times daily as needed (hemorrhoidal pain). 01/10/18   Lisa Roca, MD  metFORMIN (GLUCOPHAGE) 500 MG tablet Take 500 mg by mouth 2 (two) times daily with a meal.    [provider]  omeprazole (PRILOSEC) 20 MG capsule Take 20 mg by mouth daily.    [provider]  promethazine-dextromethorphan (PROMETHAZINE-DM) 6.25-15 MG/5ML syrup Take 5 mLs by mouth 4 (four) times daily as needed. 10/07/21   Margarette Canada, NP  witch hazel-glycerin (TUCKS) pad Apply 1 application topically as needed for itching. 01/10/18   Lisa Roca, MD    Family History Family History  Problem Relation Age of Onset   Diabetes Mother    Diabetes Father     Social History Social History   Tobacco Use   Smoking status: Never   Smokeless tobacco: Never  Vaping Use   Vaping Use: Never used  Substance  Use Topics   Alcohol use: No   Drug use: No     Allergies   Ace inhibitors, Banana, Latex, and Pecan pollen   Review of Systems Review of Systems  Constitutional:  Negative for fatigue and fever.  HENT:  Positive for congestion, postnasal drip and rhinorrhea. Negative for sinus pressure, sinus pain and sore throat.   Respiratory:  Positive for cough. Negative for shortness of breath.   Cardiovascular:  Negative for chest pain.  Gastrointestinal:  Negative for abdominal pain, diarrhea, nausea and vomiting.  Musculoskeletal:  Negative for myalgias.  Neurological:  Negative for weakness, light-headedness and  headaches.  Hematological:  Negative for adenopathy.     Physical Exam Triage Vital Signs ED Triage Vitals  Enc Vitals Group     BP      Pulse      Resp      Temp      Temp src      SpO2      Weight      Height      Head Circumference      Peak Flow      Pain Score      Pain Loc      Pain Edu?      Excl. in [place]?    No data found.  Updated Vital Signs BP (S) (!) 170/99 (BP Location: Left Arm)   Pulse 92   Temp 98.6 F (37 C) (Oral)   Ht '5\' 10"'$  (1.778 m)   Wt (!) 350 lb (158.8 kg)   SpO2 94%   BMI 50.22 kg/m      Physical Exam Vitals and nursing note reviewed.  Constitutional:      General: He is not in acute distress.    Appearance: Normal appearance. He is well-developed. He is obese. He is not ill-appearing.  HENT:     Head: Normocephalic and atraumatic.     Right Ear: There is impacted cerumen.     Left Ear: There is impacted cerumen.     Nose: Congestion present.     Mouth/Throat:     Mouth: Mucous membranes are moist.     Pharynx: Oropharynx is clear. Posterior oropharyngeal erythema present.  Eyes:     General: No scleral icterus.    Conjunctiva/sclera: Conjunctivae normal.  Cardiovascular:     Rate and Rhythm: Normal rate and regular rhythm.     Heart sounds: Normal heart sounds.  Pulmonary:     Effort: Pulmonary effort is normal. No respiratory distress.     Breath sounds: Normal breath sounds. No wheezing, rhonchi or rales.  Musculoskeletal:     Cervical back: Neck supple.  Skin:    General: Skin is warm and dry.     Capillary Refill: Capillary refill takes less than 2 seconds.  Neurological:     General: No focal deficit present.     Mental Status: He is alert. Mental status is at baseline.     Motor: No weakness.     Gait: Gait normal.  Psychiatric:        Mood and Affect: Mood normal.        Behavior: Behavior normal.      UC Treatments / Results  Labs (all labs ordered are listed, but only abnormal results are displayed) Labs  Reviewed  SARS CORONAVIRUS 2 BY RT PCR     EKG   Radiology DG Chest 2 View  Result Date: 11/29/2021 CLINICAL DATA:  Cough and congestion for 5  days EXAM: CHEST - 2 VIEW COMPARISON:  02/07/2019 FINDINGS: Frontal and lateral views of the chest demonstrate an unremarkable cardiac silhouette. No acute airspace disease, effusion, or pneumothorax. No acute bony abnormality. IMPRESSION: 1. No acute intrathoracic process. Electronically Signed   By: Randa Ngo M.D.   On: 11/29/2021 17:36    Procedures Procedures (including critical care time)  Medications Ordered in UC Medications - No data to display  Initial Impression / Assessment and Plan / UC Course  I have reviewed the triage vital signs and the nursing notes.  Pertinent labs & imaging results that were available during my care of the patient were reviewed by me and considered in my medical decision making (see chart for details).   56 year old male with diabetes presents for cough, congestion (nasal and chest) and postnasal drainage that started 5 days ago.  Denies fever or breathing difficulty.  No known COVID exposure but is agreeable to testing.  He is afebrile and overall well-appearing.  On exam he has nasal congestion, postnasal drainage and erythema posterior pharynx.  Chest clear to auscultation heart regular rate and rhythm.  Chest x-ray performed shows no evidence of pneumonia.  PCR COVID test obtained.  Advised patient we will contact him if it is positive.  Reviewed current CDC guidelines, isolation protocol and ED precautions if positive.  Advised we will not contact him if the COVID is negative.  He likely has another virus if he is not contacted by [place].  His care is supportive.  I sent cough medicine and encouraged him to increase his rest and fluids and follow-up as needed.  Negative COVID.   Final Clinical Impressions(s) / UC Diagnoses   Final diagnoses:  Viral upper respiratory tract infection  Acute cough   Nasal congestion     Discharge Instructions      -We will call and start you on antiviral medication if your COVID test is positive.  If is positive you will need to isolate 5 days and then wear mask x5 days.  If you do not hear from [place] is negative and you most likely have a virus but if you develop a fever or starts to have worsening sinus pain/pressure or are not feeling better after 10 days, please return for reevaluation as you may need an antibiotic at that point.  At this time your symptoms are most consistent with a virus.  Your x-ray was negative for pneumonia.       ED Prescriptions   None    PDMP not reviewed this encounter.   Danton Clap, PA-C 10/03/21 1049    Laurene Footman B, PA-C 11/29/21 1829

## 2021-11-29 NOTE — Discharge Instructions (Signed)
-  We will call and start you on antiviral medication if your COVID test is positive.  If is positive you will need to isolate 5 days and then wear mask x5 days.  If you do not hear from [place] is negative and you most likely have a virus but if you develop a fever or starts to have worsening sinus pain/pressure or are not feeling better after 10 days, please return for reevaluation as you may need an antibiotic at that point.  At this time your symptoms are most consistent with a virus.  Your x-ray was negative for pneumonia.

## 2022-02-14 ENCOUNTER — Ambulatory Visit
Admission: EM | Admit: 2022-02-14 | Discharge: 2022-02-14 | Disposition: A | Discharge status: Home / Self Care | Payer: Commercial Managed Care - PPO | Attending: Physician Assistant | Admitting: Physician Assistant

## 2022-02-14 DIAGNOSIS — Z1152 Encounter for screening for COVID-19: Secondary | ICD-10-CM | POA: Insufficient documentation

## 2022-02-14 DIAGNOSIS — Z8616 Personal history of COVID-19: Secondary | ICD-10-CM | POA: Insufficient documentation

## 2022-02-14 DIAGNOSIS — E119 Type 2 diabetes mellitus without complications: Secondary | ICD-10-CM | POA: Insufficient documentation

## 2022-02-14 DIAGNOSIS — Z7984 Long term (current) use of oral hypoglycemic drugs: Secondary | ICD-10-CM | POA: Insufficient documentation

## 2022-02-14 DIAGNOSIS — R059 Cough, unspecified: Secondary | ICD-10-CM | POA: Diagnosis present

## 2022-02-14 DIAGNOSIS — J329 Chronic sinusitis, unspecified: Secondary | ICD-10-CM

## 2022-02-14 DIAGNOSIS — J40 Bronchitis, not specified as acute or chronic: Secondary | ICD-10-CM

## 2022-02-14 DIAGNOSIS — Z833 Family history of diabetes mellitus: Secondary | ICD-10-CM | POA: Insufficient documentation

## 2022-02-14 LAB — RESP PANEL BY RT-PCR (RSV, FLU A&B, COVID)  RVPGX2
Influenza A by PCR: NEGATIVE
Influenza B by PCR: NEGATIVE
Resp Syncytial Virus by PCR: NEGATIVE
SARS Coronavirus 2 by RT PCR: NEGATIVE

## 2022-02-14 MED ORDER — ALBUTEROL SULFATE HFA 108 (90 BASE) MCG/ACT IN AERS: 1.0000 | INHALATION_SPRAY | Freq: Four times a day (QID) | RESPIRATORY_TRACT | 0 refills | Status: DC | PRN

## 2022-02-14 MED ORDER — AMOXICILLIN-POT CLAVULANATE 875-125 MG PO TABS
1.0000 | ORAL_TABLET | Freq: Two times a day (BID) | ORAL | 0 refills | Status: DC
Start: 2022-02-14 — End: 2023-03-08

## 2022-02-14 NOTE — ED Provider Notes (Signed)
[facility]    CSN: [id number] Arrival date & time: 02/14/22  1626      History   Chief Complaint Chief Complaint  Patient presents with   Cough   Shortness of Breath    HPI Khameron Gruenwald is a 57 y.o. male.   Patient presents today with a 4 to 5-day history of URI symptoms including cough, congestion, drainage, subjective fever, fatigue.  Denies any chest pain, nausea, vomiting, diarrhea.  He has tried Robitussin, Tylenol, Advil without improvement of symptoms.  Denies any known sick contacts but did attend his funeral with over 300 people approximately 48 hours before symptoms began.  He has had COVID in the past several years ago.  He has had COVID-19 vaccinations.  He does have a history of diabetes and reports that this is adequately controlled.  Denies any recent antibiotics or steroids.  He denies history of COPD or smoking.  Reports a history of asthma when he was younger but has not required albuterol inhaler regularly in adulthood.  He does have allergies managed with as needed cetirizine.    Past Medical History:  Diagnosis Date   Diabetes mellitus without complication ([place])    Skin cancer    Sleep apnea     Patient Active Problem List   Diagnosis Date Noted   Cough 03/06/2017    Past Surgical History:  Procedure Laterality Date   NASAL SINUS SURGERY         Home Medications    Prior to Admission medications   Medication Sig Start Date End Date Taking? Authorizing Provider  amoxicillin-clavulanate (AUGMENTIN) 875-125 MG tablet Take 1 tablet by mouth every 12 (twelve) hours. 02/14/22  Yes Miloh Alcocer K, PA-C  Blood Glucose Monitoring Suppl ([place]) w/Device KIT See admin instructions. 11/14/21  Yes [provider]  Cysteamine Bitartrate (PROCYSBI) 300 MG PACK Use 1 each once daily Use as instructed. 11/14/21 11/14/22 Yes [provider]  Lancets (ONETOUCH DELICA PLUS [id number]) [place] 1 each topically  daily. 11/14/21  Yes [provider]  albuterol (VENTOLIN HFA) 108 (90 Base) MCG/ACT inhaler Inhale 1-2 puffs into the lungs every 6 (six) hours as needed for wheezing or shortness of breath. Inhale 1-2 puffs every 4 hours as needed 02/14/22 07/19/26  Demarrius Guerrero, Derry Skill, PA-C  aspirin 81 MG chewable tablet Chew by mouth daily.    [provider]  atorvastatin (LIPITOR) 10 MG tablet Take 10 mg by mouth daily.    [provider]  cetirizine (ZYRTEC) 10 MG tablet Take 10 mg by mouth daily.    [provider]  famotidine (PEPCID) 20 MG tablet Take 1 tablet (20 mg total) by mouth 2 (two) times daily. 10/09/16 10/03/21  Darel Hong, MD  fluticasone (FLONASE) 50 MCG/ACT nasal spray Place 2 sprays into both nostrils daily. 04/15/21   [provider]  glipiZIDE (GLUCOTROL XL) 10 MG 24 hr tablet Take 10 mg by mouth daily. 10/02/21   [provider]  ipratropium (ATROVENT) 0.06 % nasal spray Place 2 sprays into both nostrils 4 (four) times daily. 10/07/21   Margarette Canada, NP  Lido-PE-Glycerin-Petrolatum (PREPARATION H RAPID RELIEF) 5-0.25-14.4-15 % CREA Place 1 application rectally 3 (three) times daily as needed (hemorrhoidal pain). 01/10/18   Lisa Roca, MD  metFORMIN (GLUCOPHAGE) 500 MG tablet Take 500 mg by mouth 2 (two) times daily with a meal.    [provider]  omeprazole (PRILOSEC) 20 MG capsule Take 20 mg by mouth daily.  [provider]  [facility] VERIO test strip 1 each daily.    [provider]  promethazine-dextromethorphan (PROMETHAZINE-DM) 6.25-15 MG/5ML syrup Take 5 mLs by mouth 4 (four) times daily as needed. 10/07/21   Margarette Canada, NP  witch hazel-glycerin (TUCKS) pad Apply 1 application topically as needed for itching. 01/10/18   Lisa Roca, MD    Family History Family History  Problem Relation Age of Onset   Diabetes Mother    Diabetes Father     Social History Social History   Tobacco Use   Smoking  status: Never   Smokeless tobacco: Never  Vaping Use   Vaping Use: Never used  Substance Use Topics   Alcohol use: No   Drug use: No     Allergies   Ace inhibitors, Banana, Latex, and Pecan pollen   Review of Systems Review of Systems  Constitutional:  Positive for activity change, fatigue and fever. Negative for appetite change.  HENT:  Positive for congestion and sinus pressure. Negative for sneezing and sore throat.   Respiratory:  Positive for cough and shortness of breath.   Cardiovascular:  Negative for chest pain.  Gastrointestinal:  Negative for abdominal pain, diarrhea, nausea and vomiting.  Neurological:  Negative for dizziness, light-headedness and headaches.     Physical Exam Triage Vital Signs ED Triage Vitals [02/14/22 1712]  Enc Vitals Group     BP (!) 157/93     Pulse Rate 86     Resp      Temp 98.1 F (36.7 C)     Temp Source Oral     SpO2 94 %     Weight (!) 350 lb (158.8 kg)     Height '5\' 10"'$  (1.778 m)     Head Circumference      Peak Flow      Pain Score 0     Pain Loc      Pain Edu?      Excl. in [place]?    No data found.  Updated Vital Signs BP (!) 157/93 (BP Location: Left Arm)   Pulse 86   Temp 98.1 F (36.7 C) (Oral)   Ht '5\' 10"'$  (1.778 m)   Wt (!) 350 lb (158.8 kg)   SpO2 94%   BMI 50.22 kg/m   Visual Acuity Right Eye Distance:   Left Eye Distance:   Bilateral Distance:    Right Eye Near:   Left Eye Near:    Bilateral Near:     Physical Exam Vitals reviewed.  Constitutional:      General: He is awake.     Appearance: Normal appearance. He is well-developed. He is not ill-appearing.     Comments: Very pleasant male appears stated age no acute distress sitting comfortably in exam room  HENT:     Head: Normocephalic and atraumatic.     Right Ear: Tympanic membrane, ear canal and external ear normal. Tympanic membrane is not erythematous or bulging.     Left Ear: Tympanic membrane, ear canal and external ear normal. Tympanic  membrane is not erythematous or bulging.     Nose: Nose normal.     Mouth/Throat:     Pharynx: Posterior oropharyngeal erythema present. No oropharyngeal exudate.     Comments: Uvula surgically absent Cardiovascular:     Rate and Rhythm: Normal rate and regular rhythm.     Heart sounds: Normal heart sounds, S1 normal and S2 normal. No murmur heard. Pulmonary:     Effort: Pulmonary effort is  normal. No accessory muscle usage or respiratory distress.     Breath sounds: Normal breath sounds. No stridor. No wheezing, rhonchi or rales.     Comments: Clear to auscultation bilaterally Neurological:     Mental Status: He is alert.  Psychiatric:        Behavior: Behavior is cooperative.      UC Treatments / Results  Labs (all labs ordered are listed, but only abnormal results are displayed) Labs Reviewed  RESP PANEL BY RT-PCR (RSV, FLU A&B, COVID)  [id number]    EKG   Radiology No results found.  Procedures Procedures (including critical care time)  Medications Ordered in UC Medications - No data to display  Initial Impression / Assessment and Plan / UC Course  I have reviewed the triage vital signs and the nursing notes.  Pertinent labs & imaging results that were available during my care of the patient were reviewed by me and considered in my medical decision making (see chart for details).     Patient is well-appearing, afebrile, nontoxic, nontachycardic.  Viral testing was obtained and he was negative for COVID, flu, RSV.  Discussed that I believe this is likely a virus but patient reports that he has the same kind of symptoms multiple times a year and often has to come back for antibiotics.  Discussed that I do not believe he needs antibiotic at this time but he reports that he does not have the time or money to come back.  I reluctantly prescribed him Augmentin but we discussed that he should hold this medication for several days and only take it if he is not improving or if  anything worsens.  He is agreeable to this.  Refill of albuterol was sent to pharmacy.  Recommended he use over-the-counter medication including Mucinex, Flonase, Tylenol.  Recommended he rest and drink plenty of fluid.  If he has any worsening or changing symptoms he needs to be seen immediately including chest pain, shortness of breath, fever, nausea, vomiting.  Strict return precautions given.  Work excuse note provided.  Final Clinical Impressions(s) / UC Diagnoses   Final diagnoses:  Sinobronchitis     Discharge Instructions      Your testing was negative.  As we discussed I am not convinced that this is a bacterial infection at this point.  I would recommend that you wait a few days before starting the antibiotic but if you are not improving or if you have any worsening symptoms you can start Augmentin twice daily for 7 days.  Use albuterol every 4-6 hours as needed for shortness of breath and cough.  Make sure that you are using over-the-counter medication for additional symptom relief including Flonase, Mucinex, Tylenol.  Make sure you are resting and drinking plenty of fluid.  If your symptoms or not improving within a week even after starting the antibiotics you need to be seen again.  If anything worsens and you have shortness of breath, high fever, nausea/vomiting interfere with oral intake, weakness you need to be seen immediately.     ED Prescriptions     Medication Sig Dispense Auth. Provider   albuterol (VENTOLIN HFA) 108 (90 Base) MCG/ACT inhaler Inhale 1-2 puffs into the lungs every 6 (six) hours as needed for wheezing or shortness of breath. Inhale 1-2 puffs every 4 hours as needed 18 g Ebonye Reade K, PA-C   amoxicillin-clavulanate (AUGMENTIN) 875-125 MG tablet Take 1 tablet by mouth every 12 (twelve) hours. 14 tablet Michai Dieppa, Derry Skill,  PA-C      PDMP not reviewed this encounter.   Terrilee Croak, PA-C 02/14/22 1826

## 2022-02-14 NOTE — ED Triage Notes (Signed)
Pt c/o cough, sneezing, wheezing onset Monday

## 2022-02-14 NOTE — Discharge Instructions (Signed)
Your testing was negative.  As we discussed I am not convinced that this is a bacterial infection at this point.  I would recommend that you wait a few days before starting the antibiotic but if you are not improving or if you have any worsening symptoms you can start Augmentin twice daily for 7 days.  Use albuterol every 4-6 hours as needed for shortness of breath and cough.  Make sure that you are using over-the-counter medication for additional symptom relief including Flonase, Mucinex, Tylenol.  Make sure you are resting and drinking plenty of fluid.  If your symptoms or not improving within a week even after starting the antibiotics you need to be seen again.  If anything worsens and you have shortness of breath, high fever, nausea/vomiting interfere with oral intake, weakness you need to be seen immediately.

## 2022-02-20 ENCOUNTER — Ambulatory Visit (INDEPENDENT_AMBULATORY_CARE_PROVIDER_SITE_OTHER): Payer: Commercial Managed Care - PPO

## 2022-02-20 ENCOUNTER — Encounter: Payer: Self-pay | Admitting: Emergency Medicine

## 2022-02-20 ENCOUNTER — Ambulatory Visit
Admission: EM | Admit: 2022-02-20 | Discharge: 2022-02-20 | Disposition: A | Discharge status: Home / Self Care | Payer: Commercial Managed Care - PPO | Attending: Physician Assistant | Admitting: Physician Assistant

## 2022-02-20 DIAGNOSIS — R051 Acute cough: Secondary | ICD-10-CM | POA: Diagnosis not present

## 2022-02-20 DIAGNOSIS — J209 Acute bronchitis, unspecified: Secondary | ICD-10-CM | POA: Diagnosis not present

## 2022-02-20 DIAGNOSIS — R5383 Other fatigue: Secondary | ICD-10-CM

## 2022-02-20 DIAGNOSIS — R0602 Shortness of breath: Secondary | ICD-10-CM

## 2022-02-20 MED ORDER — PROMETHAZINE-DM 6.25-15 MG/5ML PO SYRP
5.0000 mL | ORAL_SOLUTION | Freq: Four times a day (QID) | ORAL | 0 refills | Status: DC | PRN
Start: 2022-02-20 — End: 2023-03-08

## 2022-02-20 MED ORDER — ALBUTEROL SULFATE HFA 108 (90 BASE) MCG/ACT IN AERS: 1.0000 | INHALATION_SPRAY | Freq: Four times a day (QID) | RESPIRATORY_TRACT | 0 refills | Status: DC | PRN

## 2022-02-20 MED ORDER — PREDNISONE 20 MG PO TABS: 40.0000 mg | ORAL_TABLET | Freq: Every day | ORAL | 0 refills | Status: DC

## 2022-02-20 NOTE — ED Provider Notes (Signed)
[facility]    CSN: [id number] Arrival date & time: 02/20/22  0803      History   Chief Complaint Chief Complaint  Patient presents with   Cough    HPI Alvin Ward is a 57 y.o. male presenting for 2-week history of fatigue, productive cough, postnasal drainage and feeling feverish.  Patient was seen here 02/14/2022 and prescribed Augmentin.  He has 2 more days of this medication left.  He says he does not think it helps.  He denies any high-grade fever, sinus pain, chest pain.  He does report some shortness of breath at times.  He has been taking over-the-counter cough medicine but says it has not really helped and his cough is "nonstop."  He does have history of diabetes and sleep apnea.  Also history of nasal sinus surgery.  Patient does report that his wife and son have had similar symptoms but they seemed to get better faster.  No other complaints.  HPI  Past Medical History:  Diagnosis Date   Diabetes mellitus without complication ([place])    Skin cancer    Sleep apnea     Patient Active Problem List   Diagnosis Date Noted   Cough 03/06/2017    Past Surgical History:  Procedure Laterality Date   NASAL SINUS SURGERY         Home Medications    Prior to Admission medications   Medication Sig Start Date End Date Taking? Authorizing Provider  albuterol (VENTOLIN HFA) 108 (90 Base) MCG/ACT inhaler Inhale 1-2 puffs into the lungs every 6 (six) hours as needed for wheezing or shortness of breath. 02/20/22  Yes Danton Clap, PA-C  aspirin 81 MG chewable tablet Chew by mouth daily.   Yes [provider]  atorvastatin (LIPITOR) 10 MG tablet Take 10 mg by mouth daily.   Yes [provider]  Blood Glucose Monitoring Suppl ([place]) w/Device KIT See admin instructions. 11/14/21  Yes [provider]  cetirizine (ZYRTEC) 10 MG tablet Take 10 mg by mouth daily.   Yes [provider]  Cysteamine Bitartrate  (PROCYSBI) 300 MG PACK Use 1 each once daily Use as instructed. 11/14/21 11/14/22 Yes [provider]  fluticasone (FLONASE) 50 MCG/ACT nasal spray Place 2 sprays into both nostrils daily. 04/15/21  Yes [provider]  glipiZIDE (GLUCOTROL XL) 10 MG 24 hr tablet Take 10 mg by mouth daily. 10/02/21  Yes [provider]  ipratropium (ATROVENT) 0.06 % nasal spray Place 2 sprays into both nostrils 4 (four) times daily. 10/07/21  Yes Margarette Canada, NP  Lancets [facility] DELICA PLUS [id number]) MISC Apply 1 each topically daily. 11/14/21  Yes [provider]  Lido-PE-Glycerin-Petrolatum (PREPARATION H RAPID RELIEF) 5-0.25-14.4-15 % CREA Place 1 application rectally 3 (three) times daily as needed (hemorrhoidal pain). 01/10/18  Yes Lisa Roca, MD  metFORMIN (GLUCOPHAGE) 500 MG tablet Take 500 mg by mouth 2 (two) times daily with a meal.   Yes [provider]  omeprazole (PRILOSEC) 20 MG capsule Take 20 mg by mouth daily.   Yes [provider]  [facility] VERIO test strip 1 each daily.   Yes [provider]  predniSONE (DELTASONE) 20 MG tablet Take 2 tablets (40 mg total) by mouth daily for 5 days. 02/20/22 02/25/22 Yes Danton Clap, PA-C  promethazine-dextromethorphan (PROMETHAZINE-DM) 6.25-15 MG/5ML syrup Take 5 mLs by mouth 4 (four) times daily as needed. 02/20/22  Yes Danton Clap, PA-C  witch hazel-glycerin (TUCKS) pad  Apply 1 application topically as needed for itching. 01/10/18  Yes Lisa Roca, MD  amoxicillin-clavulanate (AUGMENTIN) 875-125 MG tablet Take 1 tablet by mouth every 12 (twelve) hours. 02/14/22   Raspet, Derry Skill, PA-C  famotidine (PEPCID) 20 MG tablet Take 1 tablet (20 mg total) by mouth 2 (two) times daily. 10/09/16 10/03/21  Darel Hong, MD    Family History Family History  Problem Relation Age of Onset   Diabetes Mother    Diabetes Father     Social History Social History   Tobacco Use   Smoking status: Never    Smokeless tobacco: Never  Vaping Use   Vaping Use: Never used  Substance Use Topics   Alcohol use: No   Drug use: No     Allergies   Ace inhibitors, Banana, Latex, and Pecan pollen   Review of Systems Review of Systems  Constitutional:  Positive for fatigue. Negative for fever.  HENT:  Positive for congestion, postnasal drip and rhinorrhea. Negative for sinus pressure, sinus pain and sore throat.   Respiratory:  Positive for cough and shortness of breath.   Cardiovascular:  Negative for chest pain.  Gastrointestinal:  Negative for abdominal pain, diarrhea, nausea and vomiting.  Musculoskeletal:  Negative for myalgias.  Neurological:  Negative for weakness, light-headedness and headaches.  Hematological:  Negative for adenopathy.  Psychiatric/Behavioral:  Positive for sleep disturbance.      Physical Exam Triage Vital Signs ED Triage Vitals  Enc Vitals Group     BP      Pulse      Resp      Temp      Temp src      SpO2      Weight      Height      Head Circumference      Peak Flow      Pain Score      Pain Loc      Pain Edu?      Excl. in [place]?    No data found.  Updated Vital Signs BP (!) 148/106 (BP Location: Right Arm)   Pulse 98   Temp 98.5 F (36.9 C) (Oral)   Resp 18   Ht '5\' 10"'$  (1.778 m)   Wt (!) 350 lb 1.5 oz (158.8 kg)   SpO2 96%   BMI 50.23 kg/m     Physical Exam Vitals and nursing note reviewed.  Constitutional:      General: He is not in acute distress.    Appearance: Normal appearance. He is well-developed. He is not ill-appearing.  HENT:     Head: Normocephalic and atraumatic.     Nose: Congestion present.     Mouth/Throat:     Mouth: Mucous membranes are moist.     Pharynx: Oropharynx is clear.  Eyes:     General: No scleral icterus.    Conjunctiva/sclera: Conjunctivae normal.  Cardiovascular:     Rate and Rhythm: Normal rate and regular rhythm.     Heart sounds: Normal heart sounds.  Pulmonary:     Effort: Pulmonary effort is  normal. No respiratory distress.     Breath sounds: Rhonchi (few scattered rhonchi bilateral upper lung fields) present.  Musculoskeletal:     Cervical back: Neck supple.  Skin:    General: Skin is warm and dry.     Capillary Refill: Capillary refill takes less than 2 seconds.  Neurological:     General: No focal deficit present.     Mental Status:  He is alert. Mental status is at baseline.     Motor: No weakness.     Gait: Gait normal.  Psychiatric:        Mood and Affect: Mood normal.        Behavior: Behavior normal.      UC Treatments / Results  Labs (all labs ordered are listed, but only abnormal results are displayed) Labs Reviewed - No data to display  EKG   Radiology DG Chest 2 View  Result Date: 02/20/2022 CLINICAL DATA:  cough, congestion x 2 weeks. has been on augmentin x 1 week without improvement EXAM: CHEST - 2 VIEW COMPARISON:  Chest x-ray 02/06/2018. FINDINGS: Mild linear opacity at the left lung base, compatible with subsegmental atelectasis. No confluent consolidation. No visible pleural effusions or pneumothorax. Cardiomediastinal silhouette is within normal limits and unchanged. IMPRESSION: Left basilar subsegmental atelectasis. Otherwise, no active cardiopulmonary disease. Electronically Signed   By: Margaretha Sheffield M.D.   On: 02/20/2022 08:50    Procedures Procedures (including critical care time)  Medications Ordered in UC Medications - No data to display  Initial Impression / Assessment and Plan / UC Course  I have reviewed the triage vital signs and the nursing notes.  Pertinent labs & imaging results that were available during my care of the patient were reviewed by me and considered in my medical decision making (see chart for details).   57 year old male presents for fatigue, productive cough and congestion as well as shortness of breath x 2 weeks.  Seen here 6 days ago and prescribed Augmentin.  He still has 2 days this medication left.  He is  also taking over-the-counter cough medicine without relief.  No recorded fever.  He is afebrile and overall well-appearing.  No acute distress.  He does cough a few times in exam room especially when asking to inhale when I auscultate his lungs.  He has nasal congestion and postnasal drainage.  Few scattered rhonchi throughout bilateral upper lung fields.    Chest x-ray obtained to assess for possible pneumonia.  X-ray without evidence of pneumonia. Advised to finish Augmentin.  Send prednisone, (DM and albuterol to pharmacy.  Encourage plenty of rest and fluids.  Explained to him that symptoms are likely due to a virus and can last for 2 to 6 weeks.  Advised him to return if he develops a fever or has increased shortness of breath or is not improving in the next couple of weeks.   Final Clinical Impressions(s) / UC Diagnoses   Final diagnoses:  Acute bronchitis, unspecified organism  Acute cough  Other fatigue  Shortness of breath     Discharge Instructions      -Your x-ray is clear.  You do not have pneumonia.  You do have bronchitis which is viral in most cases.  It can take 2 to 6 weeks to feel better.  At skin it takes more time.  I sent cough medication, prednisone and an inhaler. - Increase rest and fluid intake. - Return if you develop a fever or worsening shortness of breath.     ED Prescriptions     Medication Sig Dispense Auth. Provider   predniSONE (DELTASONE) 20 MG tablet Take 2 tablets (40 mg total) by mouth daily for 5 days. 10 tablet Danton Clap, PA-C   promethazine-dextromethorphan (PROMETHAZINE-DM) 6.25-15 MG/5ML syrup Take 5 mLs by mouth 4 (four) times daily as needed. 118 mL Laurene Footman B, PA-C   albuterol (VENTOLIN HFA) 108 (90 Base)  MCG/ACT inhaler Inhale 1-2 puffs into the lungs every 6 (six) hours as needed for wheezing or shortness of breath. 1 g Danton Clap, PA-C      PDMP not reviewed this encounter.   Danton Clap, PA-C 02/20/22 [phone]

## 2022-02-20 NOTE — Discharge Instructions (Addendum)
-  Your x-ray is clear.  You do not have pneumonia.  You do have bronchitis which is viral in most cases.  It can take 2 to 6 weeks to feel better.  At skin it takes more time.  I sent cough medication, prednisone and an inhaler. - Increase rest and fluid intake. - Return if you develop a fever or worsening shortness of breath.

## 2022-02-20 NOTE — ED Triage Notes (Signed)
Pt c/o cough, post nasal drip, intermittent subjective fever. Started about 2 weeks ago. He states he was treated with Augmentin when he was seen on 1/18 but states this did not help.

## 2022-02-24 ENCOUNTER — Ambulatory Visit (INDEPENDENT_AMBULATORY_CARE_PROVIDER_SITE_OTHER): Payer: Commercial Managed Care - PPO

## 2022-02-24 ENCOUNTER — Ambulatory Visit
Admission: EM | Admit: 2022-02-24 | Discharge: 2022-02-24 | Disposition: A | Discharge status: Home / Self Care | Payer: Commercial Managed Care - PPO | Attending: Emergency Medicine | Admitting: Emergency Medicine

## 2022-02-24 DIAGNOSIS — J069 Acute upper respiratory infection, unspecified: Secondary | ICD-10-CM

## 2022-02-24 DIAGNOSIS — R918 Other nonspecific abnormal finding of lung field: Secondary | ICD-10-CM | POA: Diagnosis not present

## 2022-02-24 DIAGNOSIS — R0602 Shortness of breath: Secondary | ICD-10-CM

## 2022-02-24 MED ORDER — AEROCHAMBER MV MISC: 2 refills | Status: AC

## 2022-02-24 MED ORDER — PULMICORT FLEXHALER 90 MCG/ACT IN AEPB
1.0000 | INHALATION_SPRAY | Freq: Two times a day (BID) | RESPIRATORY_TRACT | 0 refills | Status: AC
Start: 2022-02-24 — End: ?

## 2022-02-24 MED ORDER — BENZONATATE 100 MG PO CAPS: 200.0000 mg | ORAL_CAPSULE | Freq: Three times a day (TID) | ORAL | 0 refills | Status: DC

## 2022-02-24 MED ORDER — ALBUTEROL SULFATE (2.5 MG/3ML) 0.083% IN NEBU
2.5000 mg | INHALATION_SOLUTION | Freq: Once | RESPIRATORY_TRACT | Status: AC
  Administered 2022-02-24: 2.5 mg via RESPIRATORY_TRACT

## 2022-02-24 MED ORDER — ALBUTEROL SULFATE HFA 108 (90 BASE) MCG/ACT IN AERS: 1.0000 | INHALATION_SPRAY | Freq: Four times a day (QID) | RESPIRATORY_TRACT | 0 refills | Status: AC | PRN

## 2022-02-24 MED ORDER — AZITHROMYCIN 250 MG PO TABS: 250.0000 mg | ORAL_TABLET | Freq: Every day | ORAL | 0 refills | Status: DC

## 2022-02-24 MED ORDER — IPRATROPIUM BROMIDE 0.06 % NA SOLN: 2.0000 | Freq: Four times a day (QID) | NASAL | 12 refills | Status: DC

## 2022-02-24 NOTE — ED Triage Notes (Addendum)
Pt c/o congestion x14 days, was seen on 1/18 & most recently on 1/24 for same issue, concerned about bronchitis. Was given prednisone & albuterol for sx's w/o relief, was told he did have bronchitis which would take about 2-6 wks in order for sx's to improve. Today he c/o chest tightness & sob at times.

## 2022-02-24 NOTE — ED Provider Notes (Signed)
[facility]    CSN: [id number] Arrival date & time: 02/24/22  0848      History   Chief Complaint Chief Complaint  Patient presents with   Congestion    HPI Alvin Ward is a 57 y.o. male.   HPI  57 year old male here for evaluation of worsening respiratory symptoms.  The patient was seen here on 02/14/2022 and diagnosed with sinobronchitis and was prescribed albuterol inhaler and a 7-day course of Augmentin.  At that initial visit on 02/14/2022 he had a negative respiratory panel for COVID, influenza, and RSV.  The patient returned on 02/20/2022 with similar symptoms and was diagnosed with acute bronchitis and was prescribed 5-day course of prednisone 40 mg daily.  His albuterol inhaler was refilled and he also was prescribed Promethazine DM cough syrup.  He states that he feels like he is getting worse and worse.  He continues to have nasal congestion and he wakes up feeling like something is stuck in his throat.  He has a significant deep cough and what sounds like rhonchi per his description.  His cough is productive for yellow sputum.  He denies any fever.  He states that he is having to use his albuterol inhaler every 2 hours.  He does have a history of asthma as a child that was allergy mediated by hayfever.  He does have a PCP but he does not currently see pulmonology and he has never seen pulmonology.  On the visit dated 02/20/2022 the patient did have a chest x-ray which showed a mild linear opacity in the left lung base that was compatible with subsegmental atelectasis but otherwise no active cardiopulmonary disease.  Past Medical History:  Diagnosis Date   Diabetes mellitus without complication ([place])    Skin cancer    Sleep apnea     Patient Active Problem List   Diagnosis Date Noted   Cough 03/06/2017    Past Surgical History:  Procedure Laterality Date   NASAL SINUS SURGERY         Home Medications    Prior to Admission medications    Medication Sig Start Date End Date Taking? Authorizing Provider  amoxicillin-clavulanate (AUGMENTIN) 875-125 MG tablet Take 1 tablet by mouth every 12 (twelve) hours. 02/14/22  Yes Raspet, Derry Skill, PA-C  aspirin 81 MG chewable tablet Chew by mouth daily.   Yes [provider]  atorvastatin (LIPITOR) 10 MG tablet Take 10 mg by mouth daily.   Yes [provider]  azithromycin (ZITHROMAX Z-PAK) 250 MG tablet Take 1 tablet (250 mg total) by mouth daily. Take 2 tablets on the first day and then 1 tablet daily thereafter for a total of 5 days of treatment. 02/24/22  Yes Margarette Canada, NP  benzonatate (TESSALON) 100 MG capsule Take 2 capsules (200 mg total) by mouth every 8 (eight) hours. 02/24/22  Yes Margarette Canada, NP  Blood Glucose Monitoring Suppl ([place]) w/Device KIT See admin instructions. 11/14/21  Yes [provider]  Budesonide (PULMICORT FLEXHALER) 90 MCG/ACT inhaler Inhale 1 puff into the lungs 2 (two) times daily. 02/24/22  Yes Margarette Canada, NP  cetirizine (ZYRTEC) 10 MG tablet Take 10 mg by mouth daily.   Yes [provider]  Cysteamine Bitartrate (PROCYSBI) 300 MG PACK Use 1 each once daily Use as instructed. 11/14/21 11/14/22 Yes [provider]  fluticasone (FLONASE) 50 MCG/ACT nasal spray Place 2 sprays into both nostrils daily. 04/15/21  Yes [provider]  glipiZIDE (GLUCOTROL XL)  10 MG 24 hr tablet Take 10 mg by mouth daily. 10/02/21  Yes [provider]  ipratropium (ATROVENT) 0.06 % nasal spray Place 2 sprays into both nostrils 4 (four) times daily. 02/24/22  Yes Margarette Canada, NP  Lancets [facility] DELICA PLUS [id number]) MISC Apply 1 each topically daily. 11/14/21  Yes [provider]  Lido-PE-Glycerin-Petrolatum (PREPARATION H RAPID RELIEF) 5-0.25-14.4-15 % CREA Place 1 application rectally 3 (three) times daily as needed (hemorrhoidal pain). 01/10/18  Yes Lisa Roca, MD  metFORMIN (GLUCOPHAGE) 500  MG tablet Take 500 mg by mouth 2 (two) times daily with a meal.   Yes [provider]  omeprazole (PRILOSEC) 20 MG capsule Take 20 mg by mouth daily.   Yes [provider]  [facility] VERIO test strip 1 each daily.   Yes [provider]  promethazine-dextromethorphan (PROMETHAZINE-DM) 6.25-15 MG/5ML syrup Take 5 mLs by mouth 4 (four) times daily as needed. 02/20/22  Yes Danton Clap, PA-C  Spacer/Aero-Holding Chambers (AEROCHAMBER MV) inhaler Use as instructed 02/24/22  Yes Margarette Canada, NP  witch hazel-glycerin (TUCKS) pad Apply 1 application topically as needed for itching. 01/10/18  Yes Lisa Roca, MD  albuterol (VENTOLIN HFA) 108 (90 Base) MCG/ACT inhaler Inhale 1-2 puffs into the lungs every 6 (six) hours as needed for wheezing or shortness of breath. 02/24/22   Margarette Canada, NP  famotidine (PEPCID) 20 MG tablet Take 1 tablet (20 mg total) by mouth 2 (two) times daily. 10/09/16 10/03/21  Darel Hong, MD    Family History Family History  Problem Relation Age of Onset   Diabetes Mother    Diabetes Father     Social History Social History   Tobacco Use   Smoking status: Never   Smokeless tobacco: Never  Vaping Use   Vaping Use: Never used  Substance Use Topics   Alcohol use: No   Drug use: No     Allergies   Ace inhibitors, Banana, Latex, and Pecan pollen   Review of Systems Review of Systems  Constitutional:  Negative for fever.  HENT:  Positive for congestion and rhinorrhea.   Respiratory:  Positive for cough, shortness of breath and wheezing.      Physical Exam Triage Vital Signs ED Triage Vitals  Enc Vitals Group     BP      Pulse      Resp      Temp      Temp src      SpO2      Weight      Height      Head Circumference      Peak Flow      Pain Score      Pain Loc      Pain Edu?      Excl. in [place]?    No data found.  Updated Vital Signs BP (!) 145/84 (BP Location: Left Arm)   Pulse 87   Temp 98.1 F (36.7 C) (Oral)    Resp 16   Ht '5\' 10"'$  (1.778 m)   Wt (!) 340 lb (154.2 kg)   SpO2 94%   BMI 48.78 kg/m   Visual Acuity Right Eye Distance:   Left Eye Distance:   Bilateral Distance:    Right Eye Near:   Left Eye Near:    Bilateral Near:     Physical Exam Vitals and nursing note reviewed.  Constitutional:      Appearance: Normal appearance. He is ill-appearing.  HENT:  Head: Normocephalic and atraumatic.     Right Ear: Tympanic membrane, ear canal and external ear normal. There is no impacted cerumen.     Left Ear: Tympanic membrane, ear canal and external ear normal. There is no impacted cerumen.     Nose: Congestion and rhinorrhea present.     Comments: Patient is significant erythema and edema of his nasal mucosa.  I am unable to visualize the turbinates.  There is clear discharge in both nares to a scant degree.    Mouth/Throat:     Mouth: Mucous membranes are moist.     Pharynx: Oropharynx is clear. No oropharyngeal exudate or posterior oropharyngeal erythema.     Comments: Posterior oropharynx is poorly visualized secondary to patient's anatomy.  I do not appreciate any erythema or drainage with my limited view. Cardiovascular:     Rate and Rhythm: Normal rate and regular rhythm.     Pulses: Normal pulses.     Heart sounds: Normal heart sounds. No murmur heard.    No friction rub. No gallop.  Pulmonary:     Effort: Pulmonary effort is normal.     Breath sounds: No wheezing, rhonchi or rales.  Musculoskeletal:     Cervical back: Normal range of motion and neck supple.  Lymphadenopathy:     Cervical: No cervical adenopathy.  Skin:    General: Skin is warm.     Capillary Refill: Capillary refill takes less than 2 seconds.  Neurological:     General: No focal deficit present.     Mental Status: He is alert and oriented to person, place, and time.  Psychiatric:        Mood and Affect: Mood normal.        Behavior: Behavior normal.        Thought Content: Thought content normal.       UC Treatments / Results  Labs (all labs ordered are listed, but only abnormal results are displayed) Labs Reviewed - No data to display  EKG   Radiology DG Chest 2 View  Result Date: 02/24/2022 CLINICAL DATA:  Increased shortness of breath.  Congestion. EXAM: CHEST - 2 VIEW COMPARISON:  Chest x-ray February 20, 2022 FINDINGS: Mild persistent opacity in left base is improved. The heart, hila, and mediastinum are normal. No pneumothorax. No nodules or masses. No other infiltrates. No other acute abnormalities. IMPRESSION: Improving mild opacity in left base could represent improving subsegmental atelectasis or improving mild infiltrate. Recommend follow-up to complete resolution. Electronically Signed   By: Dorise Bullion III M.D.   On: 02/24/2022 09:52    Procedures Procedures (including critical care time)  Medications Ordered in UC Medications  albuterol (PROVENTIL) (2.5 MG/3ML) 0.083% nebulizer solution 2.5 mg (2.5 mg Nebulization Given 02/24/22 1020)    Initial Impression / Assessment and Plan / UC Course  I have reviewed the triage vital signs and the nursing notes.  Pertinent labs & imaging results that were available during my care of the patient were reviewed by me and considered in my medical decision making (see chart for details).   Patient is a mildly ill-appearing 57 year old male returning for his third visit for respiratory symptoms since 02/14/2022.  His previous visits he did have a negative respiratory panel and also a chest x-ray that showed subsegmental atelectasis in his left lung base but no other active cardiopulmonary disease.  He states that he has been having to use his albuterol inhaler for the last 2 days every 2 hours.  He  has been taking his prednisone and states that that has helped his symptoms.  He does have continued nasal congestion, despite a 7-day course of Augmentin, with postnasal drip.  His nasal mucosa is markedly edematous and I am unable to  visualize his turbinates.  He has scant clear discharge in both nares.  Oropharyngeal exam is limited due to patient's anatomy.  Cardiopulmonary exam reveals diffusely decreased lung sounds.  Patient has remote history of asthma but has not seen pulmonology and does not currently have inhaled corticosteroids.  He does have a rescue inhaler that he has been using every 2 hours that was prescribed at his initial visit and was represcribed at his most recent visit.  I am going to repeat the patient's chest x-rays since there has been a change in his cardiopulmonary presentation from his last visit.  I suspect that he is having an exacerbation of asthma secondary to a viral respiratory infection.  If the patient's chest x-ray is negative I am going to prescribe him an inhaled corticosteroid to use twice a day to help with his pulmonary inflammation along with Atrovent nasal spray to help with his nasal congestion and postnasal drip.  Chest x-ray independently reviewed and evaluated by me.  Impression: The patient's lung fields are well aerated but there does appear to be blunting of the costophrenic angle on the right lung base.  Same subsegmental atelectasis in the left lung base as present on the chest x-ray from 02/20/2022. Radiology impression states there is an improving mild opacity in the left lung base which could represent improving subsegmental atelectasis or improving mild infiltrate.  Recommend follow-up to complete resolution.  Since patient has already had a dose of Augmentin I will prescribe a 5-day course of azithromycin to cover for atypicals seeing is how the infiltrate in the left lung base is improving.  This could very well represent a pneumonia.  I am also going to start him on Pulmicort Flexhaler as it is the preferred inhaled corticosteroid for his formulary.  I will prescribe 1 puff twice daily to help improve pulmonary inflammation.  Patient is complaining that he cannot breathe but is able  to speak in full sentence without dyspnea or tachypnea.  I will order a albuterol nebulizer treatment.  Reassessment of his lung sounds reveal scattered rhonchi in all lung fields.  His SpO2 is 98% on room air with a good Pleth.  I have also prescribed Tessalon Per[place] to help with his cough and intranasal spray to help with nasal congestion.   Final Clinical Impressions(s) / UC Diagnoses   Final diagnoses:  Acute URI  Shortness of breath  Left lower lobe pulmonary infiltrate     Discharge Instructions      Your chest x-ray shows improvement over imaging from 4 days ago.  There is still a mild possible infiltrate in your left lung base.  You are been treated with Augmentin but I will add azithromycin to cover for atypical bacteria.  Take the azithromycin according to the package instructions.  Finish your prednisone as previously prescribed.  Continue to use your albuterol inhaler, 1 to 2 puffs every 4-6 hours as needed for shortness of breath or wheezing.  Use it with the spacer do not put the inhaler directly in your mouth.  I am also going to prescribe Pulmicort and I want you to use 1 puff twice daily to help decrease pulmonary inflammation and help improve your breathing.  For your nasal congestion and when should  use the Atrovent nasal spray, 2 squirts up each nostril every 6 hours.  This should help cut down on some your postnasal drip which is contributing to your symptoms.  Continue to use the cough syrup as previously prescribed.  If you feel your shortness of breath is worsening I recommend you go to the ER for evaluation.  I do recommend that you follow-up with your primary care provider this week regarding your respiratory symptoms and discuss a referral to pulmonology for pulmonary function testing given your history of asthma as a child.  It may have returned now that you have an adult.     ED Prescriptions     Medication Sig Dispense Auth. Provider   azithromycin  (ZITHROMAX Z-PAK) 250 MG tablet Take 1 tablet (250 mg total) by mouth daily. Take 2 tablets on the first day and then 1 tablet daily thereafter for a total of 5 days of treatment. 6 tablet Margarette Canada, NP   benzonatate (TESSALON) 100 MG capsule Take 2 capsules (200 mg total) by mouth every 8 (eight) hours. 21 capsule Margarette Canada, NP   ipratropium (ATROVENT) 0.06 % nasal spray Place 2 sprays into both nostrils 4 (four) times daily. 15 mL Margarette Canada, NP   Budesonide (PULMICORT FLEXHALER) 90 MCG/ACT inhaler Inhale 1 puff into the lungs 2 (two) times daily. 1 each Margarette Canada, NP   Spacer/Aero-Holding Chambers (AEROCHAMBER MV) inhaler Use as instructed 1 each Margarette Canada, NP   albuterol (VENTOLIN HFA) 108 (90 Base) MCG/ACT inhaler Inhale 1-2 puffs into the lungs every 6 (six) hours as needed for wheezing or shortness of breath. 18 g Margarette Canada, NP      PDMP not reviewed this encounter.   Margarette Canada, NP 02/24/22 1026

## 2022-02-24 NOTE — Discharge Instructions (Addendum)
Your chest x-ray shows improvement over imaging from 4 days ago.  There is still a mild possible infiltrate in your left lung base.  You are been treated with Augmentin but I will add azithromycin to cover for atypical bacteria.  Take the azithromycin according to the package instructions.  Finish your prednisone as previously prescribed.  Continue to use your albuterol inhaler, 1 to 2 puffs every 4-6 hours as needed for shortness of breath or wheezing.  Use it with the spacer do not put the inhaler directly in your mouth.  I am also going to prescribe Pulmicort and I want you to use 1 puff twice daily to help decrease pulmonary inflammation and help improve your breathing.  For your nasal congestion and when should use the Atrovent nasal spray, 2 squirts up each nostril every 6 hours.  This should help cut down on some your postnasal drip which is contributing to your symptoms.  Continue to use the cough syrup as previously prescribed.  If you feel your shortness of breath is worsening I recommend you go to the ER for evaluation.  I do recommend that you follow-up with your primary care provider this week regarding your respiratory symptoms and discuss a referral to pulmonology for pulmonary function testing given your history of asthma as a child.  It may have returned now that you have an adult.

## 2022-08-12 ENCOUNTER — Ambulatory Visit
Admission: EM | Admit: 2022-08-12 | Discharge: 2022-08-12 | Disposition: A | Discharge status: Home / Self Care | Payer: Commercial Managed Care - PPO | Attending: Urgent Care | Admitting: Urgent Care

## 2022-08-12 DIAGNOSIS — M545 Low back pain, unspecified: Secondary | ICD-10-CM | POA: Insufficient documentation

## 2022-08-12 DIAGNOSIS — R1032 Left lower quadrant pain: Secondary | ICD-10-CM | POA: Insufficient documentation

## 2022-08-12 LAB — URINALYSIS, W/ REFLEX TO CULTURE (INFECTION SUSPECTED)
Glucose, UA: NEGATIVE mg/dL
Leukocytes,Ua: NEGATIVE
Nitrite: NEGATIVE
Protein, ur: NEGATIVE mg/dL
Specific Gravity, Urine: 1.025 (ref 1.005–1.030)
pH: 5 (ref 5.0–8.0)

## 2022-08-12 NOTE — ED Triage Notes (Signed)
Pt states he has been having lower back pain all across back onset x1 week ago, pt states last night started having groin pain. Pt denies any fall or injury

## 2022-08-12 NOTE — ED Provider Notes (Signed)
[facility]    CSN: [id number] Arrival date & time: 08/12/22  0900      History   Chief Complaint No chief complaint on file.   HPI Alvin Ward is a 57 y.o. male.   HPI  Presents with complaint of lower back pain "all across my back".  Starting x 1 week.  He reports onset of "groin pain" starting last night.  PMH including DM 2  Denies past history of renal calculi though he does relate a story where he had intense pain while urinating which suddenly resolved.  He states that the back pain is mostly left side.  Reports groin pain also left side.  Denies urine discoloration.  No known presence of blood.  Past Medical History:  Diagnosis Date   Diabetes mellitus without complication ([facility])    Skin cancer    Sleep apnea     Patient Active Problem List   Diagnosis Date Noted   Cough 03/06/2017    Past Surgical History:  Procedure Laterality Date   NASAL SINUS SURGERY         Home Medications    Prior to Admission medications   Medication Sig Start Date End Date Taking? Authorizing Provider  albuterol (VENTOLIN HFA) 108 (90 Base) MCG/ACT inhaler Inhale 1-2 puffs into the lungs every 6 (six) hours as needed for wheezing or shortness of breath. 02/24/22   Becky Augusta, NP  amoxicillin-clavulanate (AUGMENTIN) 875-125 MG tablet Take 1 tablet by mouth every 12 (twelve) hours. 02/14/22   Raspet, Noberto Retort, PA-C  aspirin 81 MG chewable tablet Chew by mouth daily.    [provider]  atorvastatin (LIPITOR) 10 MG tablet Take 10 mg by mouth daily.    [provider]  azithromycin (ZITHROMAX Z-PAK) 250 MG tablet Take 1 tablet (250 mg total) by mouth daily. Take 2 tablets on the first day and then 1 tablet daily thereafter for a total of 5 days of treatment. 02/24/22   Becky Augusta, NP  benzonatate (TESSALON) 100 MG capsule Take 2 capsules (200 mg total) by mouth every 8 (eight) hours. 02/24/22   Becky Augusta, NP  Blood Glucose Monitoring Suppl  (ONETOUCH VERIO FLEX SYSTEM) w/Device KIT See admin instructions. 11/14/21   [provider]  Budesonide (PULMICORT FLEXHALER) 90 MCG/ACT inhaler Inhale 1 puff into the lungs 2 (two) times daily. 02/24/22   Becky Augusta, NP  cetirizine (ZYRTEC) 10 MG tablet Take 10 mg by mouth daily.    [provider]  Cysteamine Bitartrate (PROCYSBI) 300 MG PACK Use 1 each once daily Use as instructed. 11/14/21 11/14/22  [provider]  famotidine (PEPCID) 20 MG tablet Take 1 tablet (20 mg total) by mouth 2 (two) times daily. 10/09/16 10/03/21  Merrily Brittle, MD  fluticasone (FLONASE) 50 MCG/ACT nasal spray Place 2 sprays into both nostrils daily. 04/15/21   [provider]  glipiZIDE (GLUCOTROL XL) 10 MG 24 hr tablet Take 10 mg by mouth daily. 10/02/21   [provider]  ipratropium (ATROVENT) 0.06 % nasal spray Place 2 sprays into both nostrils 4 (four) times daily. 02/24/22   Becky Augusta, NP  Lancets [facility] DELICA PLUS [place]) MISC Apply 1 each topically daily. 11/14/21   [provider]  Lido-PE-Glycerin-Petrolatum (PREPARATION H RAPID RELIEF) 5-0.25-14.4-15 % CREA Place 1 application rectally 3 (three) times daily as needed (hemorrhoidal pain). 01/10/18   Governor Rooks, MD  metFORMIN (GLUCOPHAGE) 500 MG tablet Take 500 mg by mouth 2 (two) times daily with a meal.  [provider]  omeprazole (PRILOSEC) 20 MG capsule Take 20 mg by mouth daily.    [provider]  [facility] VERIO test strip 1 each daily.    [provider]  promethazine-dextromethorphan (PROMETHAZINE-DM) 6.25-15 MG/5ML syrup Take 5 mLs by mouth 4 (four) times daily as needed. 02/20/22   Shirlee Latch, PA-C  Spacer/Aero-Holding Chambers (AEROCHAMBER MV) inhaler Use as instructed 02/24/22   Becky Augusta, NP  witch hazel-glycerin (TUCKS) pad Apply 1 application topically as needed for itching. 01/10/18   Governor Rooks, MD    Family History Family History   Problem Relation Age of Onset   Diabetes Mother    Diabetes Father     Social History Social History   Tobacco Use   Smoking status: Never   Smokeless tobacco: Never  Vaping Use   Vaping status: Never Used  Substance Use Topics   Alcohol use: No   Drug use: No     Allergies   Ace inhibitors, Banana, Latex, and Pecan pollen   Review of Systems Review of Systems   Physical Exam Triage Vital Signs ED Triage Vitals  Encounter Vitals Group     BP      Systolic BP Percentile      Diastolic BP Percentile      Pulse      Resp      Temp      Temp src      SpO2      Weight      Height      Head Circumference      Peak Flow      Pain Score      Pain Loc      Pain Education      Exclude from Growth Chart    No data found.  Updated Vital Signs There were no vitals taken for this visit.  Visual Acuity Right Eye Distance:   Left Eye Distance:   Bilateral Distance:    Right Eye Near:   Left Eye Near:    Bilateral Near:     Physical Exam Vitals reviewed.  Constitutional:      Appearance: Normal appearance.  Abdominal:     Tenderness: There is left CVA tenderness.  Genitourinary:   Musculoskeletal:        General: Normal range of motion.     Lumbar back: No tenderness or bony tenderness. Normal range of motion.  Skin:    General: Skin is warm and dry.  Neurological:     General: No focal deficit present.     Mental Status: He is alert and oriented to person, place, and time.  Psychiatric:        Mood and Affect: Mood normal.        Behavior: Behavior normal.      UC Treatments / Results  Labs (all labs ordered are listed, but only abnormal results are displayed) Labs Reviewed - No data to display  EKG   Radiology No results found.  Procedures Procedures (including critical care time)  Medications Ordered in UC Medications - No data to display  Initial Impression / Assessment and Plan / UC Course  I have reviewed the triage vital  signs and the nursing notes.  Pertinent labs & imaging results that were available during my care of the patient were reviewed by me and considered in my medical decision making (see chart for details).   Alvin Ward is a 57 y.o. male presenting with lower back and  groin pain. Patient is afebrile without recent antipyretics, satting well on room air. Overall is well appearing, well hydrated, without respiratory distress.  Positive for left CVA tenderness.  Negative lumbar bony or muscular tenderness.  Tenderness with palpation left groin.  Negative testicular tenderness.  Reviewed relevant chart history.   Differential diagnosis includes, but not limited to, lower back pain (acute on chronic), UTI, renal calculi.  UA result is positive for hemoglobin and red blood cells.  Urine is amber which is suggestive of some transient dehydration versus discoloration by presence of blood.  Not suggestive of urinary tract infection.  Given patient's symptoms including progression to groin pain, I think the most likely diagnosis is renal calculi which the patient has possibly had in the past without a positive diagnosis.  Recommending that the patient drink increased amounts of water, use OTC medication for pain relief.  ED precautions discussed for worsening symptoms.  Counseled patient on potential for adverse effects with medications prescribed/recommended today, ER and return-to-clinic precautions discussed, patient verbalized understanding and agreement with care plan.  Final Clinical Impressions(s) / UC Diagnoses   Final diagnoses:  None   Discharge Instructions   None    ED Prescriptions   None    PDMP not reviewed this encounter.   Charma Igo, [place] 08/12/22 1042

## 2022-08-12 NOTE — Discharge Instructions (Signed)
Your physical exam and results of urine testing are suggestive of a kidney stone as the cause of your symptoms.  I am recommending that you increase the amount of water that you drink daily.  This will help to flush the stone from your system.  Use over-the-counter medication for pain relief as needed.  Go to the emergency room for evaluation if your symptoms significantly worsen or you develop new concerning symptoms.

## 2022-08-16 ENCOUNTER — Other Ambulatory Visit: Payer: Self-pay

## 2022-08-16 DIAGNOSIS — S39012A Strain of muscle, fascia and tendon of lower back, initial encounter: Secondary | ICD-10-CM | POA: Diagnosis not present

## 2022-08-16 DIAGNOSIS — M545 Low back pain, unspecified: Secondary | ICD-10-CM | POA: Diagnosis present

## 2022-08-16 DIAGNOSIS — M5442 Lumbago with sciatica, left side: Secondary | ICD-10-CM | POA: Diagnosis not present

## 2022-08-16 DIAGNOSIS — X58XXXA Exposure to other specified factors, initial encounter: Secondary | ICD-10-CM | POA: Insufficient documentation

## 2022-08-16 LAB — URINALYSIS, ROUTINE W REFLEX MICROSCOPIC
Bacteria, UA: NONE SEEN
Bilirubin Urine: NEGATIVE
Glucose, UA: NEGATIVE mg/dL
Ketones, ur: NEGATIVE mg/dL
Leukocytes,Ua: NEGATIVE
Nitrite: NEGATIVE
Protein, ur: NEGATIVE mg/dL
Specific Gravity, Urine: 1.015 (ref 1.005–1.030)
pH: 5 (ref 5.0–8.0)

## 2022-08-16 LAB — BASIC METABOLIC PANEL
Anion gap: 8 (ref 5–15)
BUN: 17 mg/dL (ref 6–20)
CO2: 26 mmol/L (ref 22–32)
Calcium: 8.8 mg/dL — ABNORMAL LOW (ref 8.9–10.3)
Chloride: 99 mmol/L (ref 98–111)
Creatinine, Ser: 1.05 mg/dL (ref 0.61–1.24)
GFR, Estimated: >60 mL/min (ref >60)
Glucose, Bld: 122 mg/dL — ABNORMAL HIGH (ref 70–99)
Potassium: 4.1 mmol/L (ref 3.5–5.1)
Sodium: 133 mmol/L — ABNORMAL LOW (ref 135–145)

## 2022-08-16 LAB — CBC
HCT: 49.3 % (ref 39.0–52.0)
Hemoglobin: 16.9 g/dL (ref 13.0–17.0)
MCH: 30.1 pg (ref 26.0–34.0)
MCHC: 34.3 g/dL (ref 30.0–36.0)
MCV: 87.9 fL (ref 80.0–100.0)
Platelets: 294 10*3/uL (ref 150–400)
RBC: 5.61 MIL/uL (ref 4.22–5.81)
RDW: 12.6 % (ref 11.5–15.5)
WBC: 10.4 10*3/uL (ref 4.0–10.5)
nRBC: 0 % (ref 0.0–0.2)

## 2022-08-16 NOTE — ED Triage Notes (Signed)
Pt to ed from home via POV for back pain over 1 week. Pt was seen at [facility] on 7/15. Pt has lower back pain as well. He was tested for kidney stone but was neg. Pt is caox4, in no acute distress and ambulatory in triage.

## 2022-08-17 ENCOUNTER — Emergency Department
Admission: EM | Admit: 2022-08-17 | Discharge: 2022-08-17 | Disposition: A | Discharge status: Home / Self Care | Payer: Commercial Managed Care - PPO | Attending: Emergency Medicine | Admitting: Emergency Medicine

## 2022-08-17 DIAGNOSIS — S39012A Strain of muscle, fascia and tendon of lower back, initial encounter: Secondary | ICD-10-CM

## 2022-08-17 DIAGNOSIS — M5432 Sciatica, left side: Secondary | ICD-10-CM

## 2022-08-17 MED ORDER — LIDOCAINE 5 % EX PTCH: 1.0000 | MEDICATED_PATCH | Freq: Two times a day (BID) | CUTANEOUS | 0 refills | Status: AC

## 2022-08-17 NOTE — ED Notes (Signed)
Patient discharged at this time. Ambulated to lobby with independent and steady gait. Breathing unlabored speaking in full sentences. Verbalized understanding of all discharge, follow up, and medication teaching. Discharged homed with all belongings.   

## 2022-08-17 NOTE — Discharge Instructions (Addendum)
Use Tylenol for pain and fevers.  Up to 1000 mg per dose, up to 4 times per day.  Do not take more than 4000 mg of Tylenol/acetaminophen within 24 hours..  Use naproxen/Aleve for anti-inflammatory pain relief. Use up to 500mg every 12 hours. Do not take more frequently than this. Do not use other NSAIDs (ibuprofen, Advil) while taking this medication. It is safe to take Tylenol with this.   Please use lidocaine patches at your site of pain.  Apply 1 patch at a time, leave on for 12 hours, then remove for 12 hours.  12 hours on, 12 hours off.  Do not apply more than 1 patch at a time.  

## 2022-08-17 NOTE — ED Provider Notes (Signed)
[facility] Provider Note    Event Date/Time   First MD Initiated Contact with Patient 08/17/22 [phone]     (approximate)   History   Back Pain (X 1 week)   HPI  Alvin Ward is a 57 y.o. male who presents to the ED for evaluation of Back Pain (X 1 week)   Obese patient presents with his son for evaluation of about a week of atraumatic left-sided lower back pain.  No falls, trauma or injuries.  No saddle anesthesias, fevers or toileting changes   Physical Exam   Triage Vital Signs: ED Triage Vitals [08/16/22 2315]  Encounter Vitals Group     BP (!) 156/99     Systolic BP Percentile      Diastolic BP Percentile      Pulse Rate 83     Resp 16     Temp 98 F (36.7 C)     Temp Source Oral     SpO2 95 %     Weight      Height 5\' 10"  (1.778 m)     Head Circumference      Peak Flow      Pain Score 6     Pain Loc      Pain Education      Exclude from Growth Chart     Most recent vital signs: Vitals:   08/16/22 2315  BP: (!) 156/99  Pulse: 83  Resp: 16  Temp: 98 F (36.7 C)  SpO2: 95%    General: Awake, no distress.  Obese, sitting upright on the edge of the bed, pleasant and conversational CV:  Good peripheral perfusion.  Resp:  Normal effort.  Abd:  No distention.  MSK:  No deformity noted.  Left greater than right paraspinal lumbar tenderness to palpation without overlying skin changes or signs of trauma.  No midline tenderness. Neuro:  No focal deficits appreciated. Other:     ED Results / Procedures / Treatments   Labs (all labs ordered are listed, but only abnormal results are displayed) Labs Reviewed  URINALYSIS, ROUTINE W REFLEX MICROSCOPIC - Abnormal; Notable for the following components:      Result Value   Color, Urine YELLOW (*)    APPearance CLEAR (*)    Hgb urine dipstick SMALL (*)    All other components within normal limits  BASIC METABOLIC PANEL - Abnormal; Notable for the following components:   Sodium 133  (*)    Glucose, Bld 122 (*)    Calcium 8.8 (*)    All other components within normal limits  CBC    EKG   RADIOLOGY   Official radiology report(s): No results found.  PROCEDURES and INTERVENTIONS:  Procedures  Medications - No data to display   IMPRESSION / MDM / ASSESSMENT AND PLAN / ED COURSE  I reviewed the triage vital signs and the nursing notes.  Differential diagnosis includes, but is not limited to, lumbar strain, cauda equina, shingles  Patient presents with evidence of MSK lower back pain.  No indications for imaging, no red flag features or neurologic deficits.  Urine is clear, doubt ureteral stone.  Normal CBC and metabolic panel.  Discussed nonnarcotic multimodal analgesia patient is suitable for outpatient management.     FINAL CLINICAL IMPRESSION(S) / ED DIAGNOSES   Final diagnoses:  Strain of lumbar region, initial encounter  Sciatica of left side     Rx / DC Orders   ED Discharge Orders  Ordered    lidocaine (LIDODERM) 5 %  Every 12 hours        08/17/22 0122             Note:  This document was prepared using Dragon voice recognition software and may include unintentional dictation errors.   Delton Prairie, MD 08/17/22 [phone]

## 2023-03-08 ENCOUNTER — Encounter: Payer: Self-pay | Admitting: Emergency Medicine

## 2023-03-08 ENCOUNTER — Ambulatory Visit
Admission: EM | Admit: 2023-03-08 | Discharge: 2023-03-08 | Disposition: A | Discharge status: Home / Self Care | Payer: Commercial Managed Care - PPO

## 2023-03-08 DIAGNOSIS — J0100 Acute maxillary sinusitis, unspecified: Secondary | ICD-10-CM

## 2023-03-08 MED ORDER — IPRATROPIUM BROMIDE 0.06 % NA SOLN: 2.0000 | Freq: Four times a day (QID) | NASAL | 12 refills | Status: AC

## 2023-03-08 MED ORDER — AMOXICILLIN-POT CLAVULANATE 875-125 MG PO TABS: 1.0000 | ORAL_TABLET | Freq: Two times a day (BID) | ORAL | 0 refills | Status: AC

## 2023-03-08 NOTE — Discharge Instructions (Addendum)
 The Augmentin  twice daily with food for 7 days for treatment of your URI.  Use the Atrovent  nasal spray, 2 squirts up each nostril every 6 hours, as needed for nasal congestion, sinus pressure, and postnasal drip.  Continue to use your Zyrtec and Nasacort as previously prescribed.  These medications should help resolve the fluid in your middle ear and prevent return of your vertigo-like symptoms.  Perform sinus irrigation 2-3 times a day with a NeilMed sinus rinse kit and distilled water.  Do not use tap water.  You can use plain over-the-counter Mucinex every 6 hours to break up the stickiness of the mucus so your body can clear it.  Increase your oral fluid intake to thin out your mucus so that is also able for your body to clear more easily.  Take an over-the-counter probiotic, such as Culturelle-align-activia, 1 hour after each dose of antibiotic to prevent diarrhea.  If you develop any new or worsening symptoms return for reevaluation or see your primary care provider.

## 2023-03-08 NOTE — ED Triage Notes (Signed)
 Patient c/o sinus congestion and pressure and nasal congestion for a week.  Patient denies fevers.

## 2023-03-08 NOTE — ED Provider Notes (Signed)
 [facility]    CSN: [id number] Arrival date & time: 03/08/23  0804      History   Chief Complaint Chief Complaint  Patient presents with   Sinus Problem   Nasal Congestion    HPI Alvin Ward is a 58 y.o. male.   HPI  58 year old male with past medical history significant for sleep apnea, skin cancer, and diabetes presents for evaluation of 1 week worth of sinus pressure.  He denies any fever, nasal drainage, or cough.  He reports that he does have significant pressure in his cheeks with the right being worse than the left.  He does have some postnasal drip and it has been both yellow and bloody.  He states that yesterday while he was driving he had about a [id number] period where the world seem to turn on its side.  That has resolved and not returned.  Past Medical History:  Diagnosis Date   Diabetes mellitus without complication (HCC)    Skin cancer    Sleep apnea     Patient Active Problem List   Diagnosis Date Noted   Cough 03/06/2017    Past Surgical History:  Procedure Laterality Date   NASAL SINUS SURGERY         Home Medications    Prior to Admission medications   Medication Sig Start Date End Date Taking? Authorizing Provider  amoxicillin -clavulanate (AUGMENTIN ) 875-125 MG tablet Take 1 tablet by mouth every 12 (twelve) hours for 7 days. 03/08/23 03/15/23 Yes Bernardino Ditch, NP  ipratropium (ATROVENT ) 0.06 % nasal spray Place 2 sprays into both nostrils 4 (four) times daily. 03/08/23  Yes Bernardino Ditch, NP  albuterol  (VENTOLIN  HFA) 108 (90 Base) MCG/ACT inhaler Inhale 1-2 puffs into the lungs every 6 (six) hours as needed for wheezing or shortness of breath. 02/24/22   Bernardino Ditch, NP  aspirin  81 MG chewable tablet Chew by mouth daily.    [provider]  atorvastatin (LIPITOR) 10 MG tablet Take 10 mg by mouth daily.    [provider]  Blood Glucose Monitoring Suppl (ONETOUCH VERIO FLEX SYSTEM) w/Device KIT See admin instructions.  11/14/21   [provider]  Budesonide  (PULMICORT  FLEXHALER) 90 MCG/ACT inhaler Inhale 1 puff into the lungs 2 (two) times daily. 02/24/22   Bernardino Ditch, NP  cetirizine (ZYRTEC) 10 MG tablet Take 10 mg by mouth daily.    [provider]  famotidine  (PEPCID ) 20 MG tablet Take 1 tablet (20 mg total) by mouth 2 (two) times daily. 10/09/16 10/03/21  Judd Betters, MD  fluticasone (FLONASE) 50 MCG/ACT nasal spray Place 2 sprays into both nostrils daily. 04/15/21   [provider]  glipiZIDE (GLUCOTROL XL) 10 MG 24 hr tablet Take 10 mg by mouth daily. 10/02/21   [provider]  Lancets (ONETOUCH DELICA PLUS [place]) MISC Apply 1 each topically daily. 11/14/21   [provider]  Lido-PE-Glycerin -Petrolatum  (PREPARATION H RAPID RELIEF) 5-0.25-14.4-15 % CREA Place 1 application rectally 3 (three) times daily as needed (hemorrhoidal pain). 01/10/18   Jacquetta Stabs, MD  lidocaine  (LIDODERM ) 5 % Place 1 patch onto the skin every 12 (twelve) hours. Remove & Discard patch within 12 hours or as directed by MD 08/17/22 08/17/23  Claudene Rover, MD  metFORMIN (GLUCOPHAGE) 500 MG tablet Take 500 mg by mouth 2 (two) times daily with a meal.    [provider]  montelukast (SINGULAIR) 10 MG tablet Take 10 mg by mouth at bedtime.    [provider]  omeprazole (PRILOSEC) 20 MG capsule Take 20 mg by mouth daily.    [provider]  [facility] VERIO test strip 1 each daily.    [provider]  Spacer/Aero-Holding Chambers (AEROCHAMBER MV) inhaler Use as instructed 02/24/22   Bernardino Ditch, NP  witch hazel-glycerin  (TUCKS) pad Apply 1 application topically as needed for itching. 01/10/18   Jacquetta Stabs, MD    Family History Family History  Problem Relation Age of Onset   Diabetes Mother    Diabetes Father     Social History Social History   Tobacco Use   Smoking status: Never   Smokeless tobacco: Never  Vaping Use   Vaping status: Never  Used  Substance Use Topics   Alcohol use: No   Drug use: No     Allergies   Ace inhibitors, Banana, Latex, and Pecan pollen   Review of Systems Review of Systems  Constitutional:  Negative for fever.  HENT:  Positive for congestion, postnasal drip and sinus pressure. Negative for rhinorrhea.   Respiratory:  Negative for cough.   Neurological:  Positive for dizziness. Negative for headaches.     Physical Exam Triage Vital Signs ED Triage Vitals  Encounter Vitals Group     BP      Systolic BP Percentile      Diastolic BP Percentile      Pulse      Resp      Temp      Temp src      SpO2      Weight      Height      Head Circumference      Peak Flow      Pain Score      Pain Loc      Pain Education      Exclude from Growth Chart    No data found.  Updated Vital Signs BP (!) 153/87 (BP Location: Left Arm)   Pulse 81   Temp 98.2 F (36.8 C) (Oral)   Resp 15   Ht 5' 10 (1.778 m)   Wt (!) 339 lb 15.2 oz (154.2 kg)   SpO2 95%   BMI 48.78 kg/m   Visual Acuity Right Eye Distance:   Left Eye Distance:   Bilateral Distance:    Right Eye Near:   Left Eye Near:    Bilateral Near:     Physical Exam Vitals and nursing note reviewed.  Constitutional:      Appearance: Normal appearance. He is not ill-appearing.  HENT:     Head: Normocephalic and atraumatic.     Right Ear: Tympanic membrane, ear canal and external ear normal. There is no impacted cerumen.     Left Ear: Tympanic membrane, ear canal and external ear normal. There is no impacted cerumen.     Ears:     Comments: Moderate cerumen in both external auditory canals but I am able to visualize the tympanic membranes.  There is a serous effusion behind the right TM though the TM is pearly gray in appearance.  Left TM is also pearly gray in appearance with normal light reflex.    Nose: Congestion and rhinorrhea present.     Comments: Nasal mucosa is erythematous and edematous with yellow discharge in both  nares.  Patient has tenderness to compression of bilateral maxillary sinuses.    Mouth/Throat:     Mouth: Mucous membranes are moist.     Pharynx: Oropharynx is clear. Posterior oropharyngeal erythema present. No oropharyngeal  exudate.     Comments: Mild erythema to the posterior oropharynx with yellow postnasal drip. Cardiovascular:     Rate and Rhythm: Normal rate and regular rhythm.     Pulses: Normal pulses.     Heart sounds: Normal heart sounds. No murmur heard.    No friction rub. No gallop.  Pulmonary:     Effort: Pulmonary effort is normal.     Breath sounds: Normal breath sounds. No wheezing, rhonchi or rales.  Musculoskeletal:     Cervical back: Normal range of motion and neck supple. No tenderness.  Lymphadenopathy:     Cervical: No cervical adenopathy.  Skin:    General: Skin is warm and dry.     Capillary Refill: Capillary refill takes less than 2 seconds.     Findings: No erythema.  Neurological:     General: No focal deficit present.     Mental Status: He is alert and oriented to person, place, and time.      UC Treatments / Results  Labs (all labs ordered are listed, but only abnormal results are displayed) Labs Reviewed - No data to display  EKG   Radiology No results found.  Procedures Procedures (including critical care time)  Medications Ordered in UC Medications - No data to display  Initial Impression / Assessment and Plan / UC Course  I have reviewed the triage vital signs and the nursing notes.  Pertinent labs & imaging results that were available during my care of the patient were reviewed by me and considered in my medical decision making (see chart for details).   Patient is a nontoxic-appearing 58 year old male presenting for evaluation of 1 week worth of sinus pressure and pain as outlined HPI above.  His physical exam does reveal inflammation of his upper respiratory tract as well as a serous effusion behind his right tympanic membrane.   This is most likely the cause of his vertigo-like symptoms he was experiencing yesterday.  Given that he has had symptoms for over a week a trial of antibiotics is warranted so I will discharge him home on Augmentin  875 twice daily with food for 7 days.  Also Atrovent  nasal spray to help with the congestion and postnasal drip.  He does take Zyrtec and Nasacort daily, which I have advised him to continue to help resolve the fluid in his middle ear and see if it prevents return of his vertigo-like symptoms.  Return precautions reviewed.   Final Clinical Impressions(s) / UC Diagnoses   Final diagnoses:  Acute non-recurrent maxillary sinusitis     Discharge Instructions      The Augmentin  twice daily with food for 7 days for treatment of your URI.  Use the Atrovent  nasal spray, 2 squirts up each nostril every 6 hours, as needed for nasal congestion, sinus pressure, and postnasal drip.  Continue to use your Zyrtec and Nasacort as previously prescribed.  These medications should help resolve the fluid in your middle ear and prevent return of your vertigo-like symptoms.  Perform sinus irrigation 2-3 times a day with a NeilMed sinus rinse kit and distilled water.  Do not use tap water.  You can use plain over-the-counter Mucinex every 6 hours to break up the stickiness of the mucus so your body can clear it.  Increase your oral fluid intake to thin out your mucus so that is also able for your body to clear more easily.  Take an over-the-counter probiotic, such as Culturelle-align-activia, 1 hour after each dose of  antibiotic to prevent diarrhea.  If you develop any new or worsening symptoms return for reevaluation or see your primary care provider.      ED Prescriptions     Medication Sig Dispense Auth. Provider   amoxicillin -clavulanate (AUGMENTIN ) 875-125 MG tablet Take 1 tablet by mouth every 12 (twelve) hours for 7 days. 14 tablet Bernardino Ditch, NP   ipratropium (ATROVENT ) 0.06 % nasal  spray Place 2 sprays into both nostrils 4 (four) times daily. 15 mL Bernardino Ditch, NP      PDMP not reviewed this encounter.   Bernardino Ditch, NP 03/08/23 [phone]

## 2023-03-25 ENCOUNTER — Ambulatory Visit (INDEPENDENT_AMBULATORY_CARE_PROVIDER_SITE_OTHER): Payer: Commercial Managed Care - PPO

## 2023-03-25 ENCOUNTER — Ambulatory Visit
Admission: EM | Admit: 2023-03-25 | Discharge: 2023-03-25 | Disposition: A | Discharge status: Home / Self Care | Payer: Commercial Managed Care - PPO | Attending: Family Medicine | Admitting: Family Medicine

## 2023-03-25 DIAGNOSIS — M545 Low back pain, unspecified: Secondary | ICD-10-CM

## 2023-03-25 DIAGNOSIS — G8929 Other chronic pain: Secondary | ICD-10-CM

## 2023-03-25 DIAGNOSIS — R030 Elevated blood-pressure reading, without diagnosis of hypertension: Secondary | ICD-10-CM | POA: Diagnosis not present

## 2023-03-25 MED ORDER — METHOCARBAMOL 500 MG PO TABS: 500.0000 mg | ORAL_TABLET | Freq: Two times a day (BID) | ORAL | 0 refills | Status: AC

## 2023-03-25 MED ORDER — PREDNISONE 10 MG (21) PO TBPK: ORAL_TABLET | Freq: Every day | ORAL | 0 refills | Status: AC

## 2023-03-25 MED ORDER — GABAPENTIN 100 MG PO CAPS: 100.0000 mg | ORAL_CAPSULE | Freq: Every day | ORAL | 0 refills | Status: AC

## 2023-03-25 MED ORDER — KETOROLAC TROMETHAMINE 60 MG/2ML IM SOLN
30.0000 mg | Freq: Once | INTRAMUSCULAR | Status: AC
  Administered 2023-03-25: 30 mg via INTRAMUSCULAR

## 2023-03-25 NOTE — ED Provider Notes (Addendum)
 [facility]    CSN: [id number] Arrival date & time: 03/25/23  1449      History   Chief Complaint Chief Complaint  Patient presents with   Back Pain    HPI  HPI Alvin Ward is a 58 y.o. male.   Alvin Ward presents for sharp lower back pain that started yesterday. He woke up with severe back pain. Has history of spinal stenosis which was diagnosed in the 90s and sciatica. Tried Tylenol without relief.  Used TENS unit which helped but as soon as he stopped it the pain came back. Warm heating pads and stretching aren't helping either. Theses things typically help. Pain is not radiating down his legs. Pain feels "different." Has been riding around at work in a small car.   Goes to a chiropractor and has an appointment on Monday.     Past Medical History:  Diagnosis Date   Diabetes mellitus without complication (HCC)    Skin cancer    Sleep apnea     Patient Active Problem List   Diagnosis Date Noted   Cough 03/06/2017    Past Surgical History:  Procedure Laterality Date   NASAL SINUS SURGERY         Home Medications    Prior to Admission medications   Medication Sig Start Date End Date Taking? Authorizing Provider  albuterol (VENTOLIN HFA) 108 (90 Base) MCG/ACT inhaler Inhale 1-2 puffs into the lungs every 6 (six) hours as needed for wheezing or shortness of breath. 02/24/22  Yes Becky Augusta, NP  aspirin 81 MG chewable tablet Chew by mouth daily.   Yes [provider]  atorvastatin (LIPITOR) 10 MG tablet Take 10 mg by mouth daily.   Yes [provider]  Blood Glucose Monitoring Suppl (ONETOUCH VERIO FLEX SYSTEM) w/Device KIT See admin instructions. 11/14/21  Yes [provider]  Budesonide (PULMICORT FLEXHALER) 90 MCG/ACT inhaler Inhale 1 puff into the lungs 2 (two) times daily. 02/24/22  Yes Becky Augusta, NP  cetirizine (ZYRTEC) 10 MG tablet Take 10 mg by mouth daily.   Yes [provider]  famotidine (PEPCID) 20  MG tablet Take 1 tablet (20 mg total) by mouth 2 (two) times daily. 10/09/16 03/25/23 Yes Merrily Brittle, MD  fluticasone (FLONASE) 50 MCG/ACT nasal spray Place 2 sprays into both nostrils daily. 04/15/21  Yes [provider]  gabapentin (NEURONTIN) 100 MG capsule Take 1 capsule (100 mg total) by mouth at bedtime. 03/25/23  Yes Fahad Cisse, DO  glipiZIDE (GLUCOTROL XL) 10 MG 24 hr tablet Take 10 mg by mouth daily. 10/02/21  Yes [provider]  Lancets (ONETOUCH DELICA PLUS LANCET33G) MISC Apply 1 each topically daily. 11/14/21  Yes [provider]  Lido-PE-Glycerin-Petrolatum (PREPARATION H RAPID RELIEF) 5-0.25-14.4-15 % CREA Place 1 application rectally 3 (three) times daily as needed (hemorrhoidal pain). 01/10/18  Yes Governor Rooks, MD  lidocaine (LIDODERM) 5 % Place 1 patch onto the skin every 12 (twelve) hours. Remove & Discard patch within 12 hours or as directed by MD 08/17/22 08/17/23 Yes Delton Prairie, MD  metFORMIN (GLUCOPHAGE) 500 MG tablet Take 500 mg by mouth 2 (two) times daily with a meal.   Yes [provider]  methocarbamol (ROBAXIN) 500 MG tablet Take 1 tablet (500 mg total) by mouth 2 (two) times daily. 03/25/23  Yes Nilesh Stegall, DO  montelukast (SINGULAIR) 10 MG tablet Take 10 mg by mouth at bedtime.   Yes [provider]  omeprazole (PRILOSEC) 20 MG capsule Take  20 mg by mouth daily.   Yes [provider]  [facility] VERIO test strip 1 each daily.   Yes [provider]  predniSONE (STERAPRED UNI-PAK 21 TAB) 10 MG (21) TBPK tablet Take by mouth daily. Take 6 tabs by mouth daily for 1, then 5 tabs for 1 day, then 4 tabs for 1 day, then 3 tabs for 1 day, then 2 tabs for 1 day, then 1 tab for 1 day. 03/25/23  Yes Quanna Wittke, Seward Meth, DO  Spacer/Aero-Holding Chambers (AEROCHAMBER MV) inhaler Use as instructed 02/24/22  Yes Becky Augusta, NP  triamcinolone (NASACORT) 55 MCG/ACT AERO nasal inhaler Place 2 sprays into both nostrils once  daily 05/10/22 05/10/23 Yes [provider]  witch hazel-glycerin (TUCKS) pad Apply 1 application topically as needed for itching. 01/10/18  Yes Governor Rooks, MD  ipratropium (ATROVENT) 0.06 % nasal spray Place 2 sprays into both nostrils 4 (four) times daily. 03/08/23   Becky Augusta, NP    Family History Family History  Problem Relation Age of Onset   Diabetes Mother    Diabetes Father     Social History Social History   Tobacco Use   Smoking status: Never   Smokeless tobacco: Never  Vaping Use   Vaping status: Never Used  Substance Use Topics   Alcohol use: No   Drug use: No     Allergies   Ace inhibitors, Banana, Latex, and Pecan pollen   Review of Systems Review of Systems: egative unless otherwise stated in HPI.      Physical Exam Triage Vital Signs ED Triage Vitals  Encounter Vitals Group     BP 03/25/23 1454 (!) 172/95     Systolic BP Percentile --      Diastolic BP Percentile --      Pulse Rate 03/25/23 1454 90     Resp 03/25/23 1454 16     Temp 03/25/23 1454 98.2 F (36.8 C)     Temp Source 03/25/23 1454 Oral     SpO2 03/25/23 1454 94 %     Weight 03/25/23 1453 (!) 350 lb (158.8 kg)     Height 03/25/23 1453 5\' 10"  (1.778 m)     Head Circumference --      Peak Flow --      Pain Score 03/25/23 1502 7     Pain Loc --      Pain Education --      Exclude from Growth Chart --    No data found.  Updated Vital Signs BP (!) 168/104 (BP Location: Left Arm)   Pulse 90   Temp 98.2 F (36.8 C) (Oral)   Resp 16   Ht 5\' 10"  (1.778 m)   Wt (!) 158.8 kg   SpO2 94%   BMI 50.22 kg/m   Visual Acuity Right Eye Distance:   Left Eye Distance:   Bilateral Distance:    Right Eye Near:   Left Eye Near:    Bilateral Near:     Physical Exam GEN: well appearing male in no acute distress  CVS: well perfused, RRR  RESP: speaking in full sentences without pause, no respiratory distress, clear  MSK:  Lumbar spine: - Inspection: no gross deformity or  asymmetry, swelling or ecchymosis. No skin changes  - Palpation: TTP over the midline lower T and L spinous processes, bilateral lumbar paraspinal muscle hypertonicity, no SI joint tenderness bilaterally - ROM: good active ROM of the lumbar spine in flexion and extension but with mild pain  -  Strength: 5/5 strength of lower extremity in L4-S1 nerve root distributions b/l - Neuro: sensation intact in the L4-S1 nerve root distribution - Special testing: Negative straight leg raise bilaterally SKIN: warm, dry, no overly skin rash or erythema    UC Treatments / Results  Labs (all labs ordered are listed, but only abnormal results are displayed) Labs Reviewed - No data to display  EKG   Radiology DG Lumbar Spine Complete Result Date: 03/25/2023 CLINICAL DATA:  Right lower back pain for 1 day, history of spinal stenosis EXAM: LUMBAR SPINE - COMPLETE 4+ VIEW COMPARISON:  None Available. FINDINGS: Frontal, bilateral oblique, lateral views of the lumbar spine are obtained on 5 images. There are 5 non-rib-bearing lumbar type vertebral bodies in normal anatomic alignment. There are no acute displaced fractures. Mild spondylosis and facet hypertrophy at the thoracolumbar and lumbosacral junctions. Sacroiliac joints appear unremarkable. IMPRESSION: 1. Mild degenerative changes at the thoracolumbar and lumbosacral junctions. 2. No acute bony abnormalities. Electronically Signed   By: Sharlet Salina M.D.   On: 03/25/2023 16:55     Procedures Procedures (including critical care time)  Medications Ordered in UC Medications  ketorolac (TORADOL) injection 30 mg (30 mg Intramuscular Given 03/25/23 1547)    Initial Impression / Assessment and Plan / UC Course  I have reviewed the triage vital signs and the nursing notes.  Pertinent labs & imaging results that were available during my care of the patient were reviewed by me and considered in my medical decision making (see chart for details).      Pt  is a 58 y.o.  male with history of spinal stenosis and sciatica presents for acute lower back pain without known injury. Given Toradol 30 mg IM for pain. On chart review, I do not see any lumbar imaging.  Obtained lumbar plain films.  Xray personally interpreted by me were unremarkable for fracture, or significant malalignment.  Radiologist report reviewed and notes mild degenerative changes at the thoracolumbar and lumbosacral junctions.  Patient to gradually return to normal activities, as tolerated and continue ordinary activities within the limits permitted by pain. Prescribed Gabapentin, prednisone and muscle relaxer  for pain relief.  Pt to monitor his blood pressure and blood sugar while taking prednisone. Tylenol and Lidocaine patches PRN for multimodal pain relief. Counseled patient on red flag symptoms and when to seek immediate care. No red flags suggesting cauda equina syndrome or progressive major motor weakness. Patient to follow up with orthopedic provider, if symptoms do not improve with conservative treatment.    [place] is hypertensive here.  BP 172/95 then after sitting was 168/104. Previous blood pressures from chart review were  elevated. I suspect high blood pressure issue is secondary to pain but he may have underlying hypertension. Recommended he check hisblood pressure and follow up with his primary care provider in the next 2-3 weeks, if they remain elevated.    Return and ED precautions given. Discussed MDM, treatment plan and plan for follow-up with patient who agrees with plan.   Final Clinical Impressions(s) / UC Diagnoses   Final diagnoses:  Chronic bilateral low back pain, unspecified whether sciatica present  Acute bilateral low back pain without sciatica  Elevated blood pressure reading without diagnosis of hypertension     Discharge Instructions      If medication was prescribed, stop by the pharmacy to pick up your prescriptions.  For your back pain, Take 1500  mg Tylenol twice a day, take ibuprofen 600 mg 3-4 times a  day, take muscle muscle relaxer Robaxin twice a day, and Gabapentin, has needed for pain. Consider stopping by the pharmacy or dollar store to pick up some Lidocaine patches. Apply for 12 hours and then remove.   Watch for worsening symptoms such as an increasing weakness or loss of sensation in your arms or legs, increasing pain and/or the loss of bladder or bowel function. Should any of these occur, go to the emergency department immediately.   Monitor your blood pressure and blood sugars while taking prednisone.      ED Prescriptions     Medication Sig Dispense Auth. Provider   gabapentin (NEURONTIN) 100 MG capsule Take 1 capsule (100 mg total) by mouth at bedtime. 20 capsule Coden Franchi, DO   predniSONE (STERAPRED UNI-PAK 21 TAB) 10 MG (21) TBPK tablet Take by mouth daily. Take 6 tabs by mouth daily for 1, then 5 tabs for 1 day, then 4 tabs for 1 day, then 3 tabs for 1 day, then 2 tabs for 1 day, then 1 tab for 1 day. 21 tablet Advay Volante, DO   methocarbamol (ROBAXIN) 500 MG tablet Take 1 tablet (500 mg total) by mouth 2 (two) times daily. 20 tablet Katha Cabal, DO      PDMP not reviewed this encounter.       Katha Cabal, DO 03/25/23 1718

## 2023-03-25 NOTE — Discharge Instructions (Signed)
 If medication was prescribed, stop by the pharmacy to pick up your prescriptions.  For your back pain, Take 1500 mg Tylenol twice a day, take ibuprofen 600 mg 3-4 times a day, take muscle muscle relaxer Robaxin twice a day, and Gabapentin, has needed for pain. Consider stopping by the pharmacy or dollar store to pick up some Lidocaine patches. Apply for 12 hours and then remove.   Watch for worsening symptoms such as an increasing weakness or loss of sensation in your arms or legs, increasing pain and/or the loss of bladder or bowel function. Should any of these occur, go to the emergency department immediately.   Monitor your blood pressure and blood sugars while taking prednisone.

## 2023-03-25 NOTE — ED Triage Notes (Signed)
 Pt c/o lower back pain x1 day. Denies any falls,injuries or heavy lifting. States constant sharp pain. Has tried APAP w/o relief.

## 2023-05-02 ENCOUNTER — Encounter: Payer: Self-pay | Admitting: Emergency Medicine

## 2023-05-02 ENCOUNTER — Ambulatory Visit
Admission: EM | Admit: 2023-05-02 | Discharge: 2023-05-02 | Disposition: A | Discharge status: Home / Self Care | Attending: Family Medicine | Admitting: Family Medicine

## 2023-05-02 DIAGNOSIS — R6889 Other general symptoms and signs: Secondary | ICD-10-CM

## 2023-05-02 DIAGNOSIS — Z20828 Contact with and (suspected) exposure to other viral communicable diseases: Secondary | ICD-10-CM | POA: Diagnosis not present

## 2023-05-02 MED ORDER — OSELTAMIVIR PHOSPHATE 75 MG PO CAPS: 75.0000 mg | ORAL_CAPSULE | Freq: Two times a day (BID) | ORAL | 0 refills | Status: AC

## 2023-05-02 MED ORDER — ONDANSETRON HCL 4 MG PO TABS: 4.0000 mg | ORAL_TABLET | Freq: Four times a day (QID) | ORAL | 0 refills | Status: AC

## 2023-05-02 NOTE — ED Triage Notes (Signed)
 Patient states that his wife was diagnosed with the flu this week.  Patient states that he had cough, chest congestion and bodyaches that started on Wed.  Patient unsure of fevers.

## 2023-05-02 NOTE — Discharge Instructions (Addendum)
 I suspect you have the flu like your family therefore we will treat you for influenza with Tamiflu. Stop by the pharmacy to pick up your prescriptions.  Follow up with your primary care provider or return to the urgent care, if not improving.   It is important that you stay hydrated.

## 2023-05-02 NOTE — ED Provider Notes (Addendum)
 [facility]    CSN: [id number] Arrival date & time: 05/02/23  0825      History   Chief Complaint Chief Complaint  Patient presents with   Generalized Body Aches   Cough    HPI Alvin Ward is a 58 y.o. male.   HPI  History obtained from the patient. Alvin Ward presents for influenza symptoms.  His wife and son have tested positive for influenza in the past couple of days here at the urgent care.  Has body aches, cough, diarrhea, nausea, headache, rhinorrhea, fever and chills that started 2 days ago. Taking Robitussin and Mucinex with some relief of his cough.   Has history of asthma. Denies vaping and smoking.     Past Medical History:  Diagnosis Date   Diabetes mellitus without complication (HCC)    Skin cancer    Sleep apnea     Patient Active Problem List   Diagnosis Date Noted   Cough 03/06/2017    Past Surgical History:  Procedure Laterality Date   NASAL SINUS SURGERY         Home Medications    Prior to Admission medications   Medication Sig Start Date End Date Taking? Authorizing Provider  ondansetron (ZOFRAN) 4 MG tablet Take 1-2 tablets (4-8 mg total) by mouth every 6 (six) hours. 05/02/23  Yes River Mckercher, Seward Meth, DO  oseltamivir (TAMIFLU) 75 MG capsule Take 1 capsule (75 mg total) by mouth every 12 (twelve) hours. 05/02/23  Yes Laylanie Kruczek, DO  albuterol (VENTOLIN HFA) 108 (90 Base) MCG/ACT inhaler Inhale 1-2 puffs into the lungs every 6 (six) hours as needed for wheezing or shortness of breath. 02/24/22   Becky Augusta, NP  aspirin 81 MG chewable tablet Chew by mouth daily.    [provider]  atorvastatin (LIPITOR) 10 MG tablet Take 10 mg by mouth daily.    [provider]  Blood Glucose Monitoring Suppl (ONETOUCH VERIO FLEX SYSTEM) w/Device KIT See admin instructions. 11/14/21   [provider]  Budesonide (PULMICORT FLEXHALER) 90 MCG/ACT inhaler Inhale 1 puff into the lungs 2 (two) times daily. 02/24/22   Becky Augusta, NP  cetirizine (ZYRTEC) 10 MG tablet Take 10 mg by mouth daily.    [provider]  famotidine (PEPCID) 20 MG tablet Take 1 tablet (20 mg total) by mouth 2 (two) times daily. 10/09/16 03/25/23  Merrily Brittle, MD  fluticasone (FLONASE) 50 MCG/ACT nasal spray Place 2 sprays into both nostrils daily. 04/15/21   [provider]  gabapentin (NEURONTIN) 100 MG capsule Take 1 capsule (100 mg total) by mouth at bedtime. 03/25/23   Niyana Chesbro, Seward Meth, DO  glipiZIDE (GLUCOTROL XL) 10 MG 24 hr tablet Take 10 mg by mouth daily. 10/02/21   [provider]  ipratropium (ATROVENT) 0.06 % nasal spray Place 2 sprays into both nostrils 4 (four) times daily. 03/08/23   Becky Augusta, NP  Lancets [facility] DELICA PLUS [place]) MISC Apply 1 each topically daily. 11/14/21   [provider]  Lido-PE-Glycerin-Petrolatum (PREPARATION H RAPID RELIEF) 5-0.25-14.4-15 % CREA Place 1 application rectally 3 (three) times daily as needed (hemorrhoidal pain). 01/10/18   Governor Rooks, MD  lidocaine (LIDODERM) 5 % Place 1 patch onto the skin every 12 (twelve) hours. Remove & Discard patch within 12 hours or as directed by MD 08/17/22 08/17/23  Delton Prairie, MD  metFORMIN (GLUCOPHAGE) 500 MG tablet Take 500 mg by mouth 2 (two) times daily with a meal.    [provider]  methocarbamol (  ROBAXIN) 500 MG tablet Take 1 tablet (500 mg total) by mouth 2 (two) times daily. 03/25/23   Ladonya Jerkins, Seward Meth, DO  montelukast (SINGULAIR) 10 MG tablet Take 10 mg by mouth at bedtime.    [provider]  omeprazole (PRILOSEC) 20 MG capsule Take 20 mg by mouth daily.    [provider]  [facility] VERIO test strip 1 each daily.    [provider]  predniSONE (STERAPRED UNI-PAK 21 TAB) 10 MG (21) TBPK tablet Take by mouth daily. Take 6 tabs by mouth daily for 1, then 5 tabs for 1 day, then 4 tabs for 1 day, then 3 tabs for 1 day, then 2 tabs for 1 day, then 1 tab for 1 day. 03/25/23    Katha Cabal, DO  Spacer/Aero-Holding Chambers (AEROCHAMBER MV) inhaler Use as instructed 02/24/22   Becky Augusta, NP  triamcinolone (NASACORT) 55 MCG/ACT AERO nasal inhaler Place 2 sprays into both nostrils once daily 05/10/22 05/10/23  [provider]  witch hazel-glycerin (TUCKS) pad Apply 1 application topically as needed for itching. 01/10/18   Governor Rooks, MD    Family History Family History  Problem Relation Age of Onset   Diabetes Mother    Diabetes Father     Social History Social History   Tobacco Use   Smoking status: Never   Smokeless tobacco: Never  Vaping Use   Vaping status: Never Used  Substance Use Topics   Alcohol use: No   Drug use: No     Allergies   Ace inhibitors, Banana, Latex, and Pecan pollen   Review of Systems Review of Systems: negative unless otherwise stated in HPI.      Physical Exam Triage Vital Signs ED Triage Vitals [05/02/23 0827]  Encounter Vitals Group     BP      Systolic BP Percentile      Diastolic BP Percentile      Pulse      Resp      Temp      Temp src      SpO2      Weight (!) 350 lb 1.5 oz (158.8 kg)     Height 5\' 10"  (1.778 m)     Head Circumference      Peak Flow      Pain Score 6     Pain Loc      Pain Education      Exclude from Growth Chart    No data found.  Updated Vital Signs BP 129/84 (BP Location: Left Arm)   Pulse (!) 107   Temp 98.1 F (36.7 C) (Oral)   Resp 15   Ht 5\' 10"  (1.778 m)   Wt (!) 158.8 kg   SpO2 95%   BMI 50.23 kg/m   Visual Acuity Right Eye Distance:   Left Eye Distance:   Bilateral Distance:    Right Eye Near:   Left Eye Near:    Bilateral Near:     Physical Exam GEN:     alert, non-toxic appearing male in no distress    EYES:   no scleral injection or discharge NECK:  normal ROM, no meningismus   RESP:  no increased work of breathing, clear to auscultation bilaterally CVS:   regular rhythm, tachycardic Skin:   warm and dry    UC Treatments /  Results  Labs (all labs ordered are listed, but only abnormal results are displayed) Labs Reviewed - No data to display  EKG   Radiology  No results found.  Procedures Procedures (including critical care time)  Medications Ordered in UC Medications - No data to display  Initial Impression / Assessment and Plan / UC Course  I have reviewed the triage vital signs and the nursing notes.  Pertinent labs & imaging results that were available during my care of the patient were reviewed by me and considered in my medical decision making (see chart for details).       Pt is a 58 y.o. male who presents for 2 days of flu-like symptoms with close family exposures at home. Keshav is afebrile here with recent antipyretics. Satting well on room air. Overall pt is non-toxic appearing, well hydrated, without respiratory distress. Pulmonary exam is unremarkable. Influenza testing deferred due to his symptoms and close exposure. We will treat with Tamiflu and give Zofran for nausea. Discussed symptomatic treatment.   Typical duration of symptoms discussed.   Return and ED precautions given and voiced understanding. Discussed MDM, treatment plan and plan for follow-up with patient who agrees with plan.     Final Clinical Impressions(s) / UC Diagnoses   Final diagnoses:  Exposure to influenza  Flu-like symptoms     Discharge Instructions      I suspect you have the flu like your family therefore we will treat you for influenza with Tamiflu. Stop by the pharmacy to pick up your prescriptions.  Follow up with your primary care provider or return to the urgent care, if not improving.   It is important that you stay hydrated.      ED Prescriptions     Medication Sig Dispense Auth. Provider   ondansetron (ZOFRAN) 4 MG tablet Take 1-2 tablets (4-8 mg total) by mouth every 6 (six) hours. 20 tablet Mansfield Dann, DO   oseltamivir (TAMIFLU) 75 MG capsule Take 1 capsule (75 mg total) by  mouth every 12 (twelve) hours. 10 capsule Katha Cabal, DO      PDMP not reviewed this encounter.       Katha Cabal, DO 05/02/23 [phone]

## 2023-09-17 ENCOUNTER — Inpatient Hospital Stay: Attending: Oncology | Admitting: Oncology

## 2023-09-17 ENCOUNTER — Inpatient Hospital Stay

## 2023-09-17 ENCOUNTER — Encounter: Payer: Self-pay | Admitting: Oncology

## 2023-09-17 VITALS — BP 132/96 | HR 86 | Temp 97.6°F | Resp 16 | Wt 337.0 lb

## 2023-09-17 DIAGNOSIS — D751 Secondary polycythemia: Secondary | ICD-10-CM

## 2023-09-17 LAB — CBC WITH DIFFERENTIAL/PLATELET
Abs Immature Granulocytes: 0.04 K/uL (ref 0.00–0.07)
Basophils Absolute: 0.1 K/uL (ref 0.0–0.1)
Basophils Relative: 1 %
Eosinophils Absolute: 0.2 K/uL (ref 0.0–0.5)
Eosinophils Relative: 2 %
HCT: 50.2 % (ref 39.0–52.0)
Hemoglobin: 17.4 g/dL — ABNORMAL HIGH (ref 13.0–17.0)
Immature Granulocytes: 1 %
Lymphocytes Relative: 22 %
Lymphs Abs: 1.9 K/uL (ref 0.7–4.0)
MCH: 30.5 pg (ref 26.0–34.0)
MCHC: 34.7 g/dL (ref 30.0–36.0)
MCV: 87.9 fL (ref 80.0–100.0)
Monocytes Absolute: 0.9 K/uL (ref 0.1–1.0)
Monocytes Relative: 10 %
Neutro Abs: 5.6 K/uL (ref 1.7–7.7)
Neutrophils Relative %: 64 %
Platelets: 251 K/uL (ref 150–400)
RBC: 5.71 MIL/uL (ref 4.22–5.81)
RDW: 13.2 % (ref 11.5–15.5)
WBC: 8.7 K/uL (ref 4.0–10.5)
nRBC: 0 % (ref 0.0–0.2)

## 2023-09-17 LAB — URINALYSIS, COMPLETE (UACMP) WITH MICROSCOPIC
Bacteria, UA: NONE SEEN
Bilirubin Urine: NEGATIVE
Glucose, UA: NEGATIVE mg/dL
Hgb urine dipstick: NEGATIVE
Ketones, ur: NEGATIVE mg/dL
Leukocytes,Ua: NEGATIVE
Nitrite: NEGATIVE
Protein, ur: NEGATIVE mg/dL
Specific Gravity, Urine: 1.015 (ref 1.005–1.030)
pH: 5 (ref 5.0–8.0)

## 2023-09-17 NOTE — Progress Notes (Signed)
 Hematology/Oncology Consult note [facility] Telephone:([phone] [phone]) [phone]  Patient Care Team: [facility] as PCP - General Melanee Annah BROCKS, MD as Consulting Physician (Oncology)   Name of the patient: Alvin Ward  [id number]  12/06/1965    Reason for referral-polycythemia   Referring physician-[facility]  Date of visit: 09/17/23   History of presenting illness-patient is a 58 year old male with a past medical history Significant for type 2 diabetes, obstructive sleep apnea and referred for polycythemia.  CBC from 08/19/2023 showed white count of 10.7, H&H of 17.7/51.6 and platelet count of 272.  Looking back at his prior CBCs his hemoglobin has been mostly between 16-17 without a clear rising trend.  He is a non-smoker.  No prior history of thrombosis.  No history of testosterone  replacement therapy.  He has not been able to use his CPAP recently as he was unable to tolerate it  ECOG PS- 1  Pain scale- 0   Review of systems- Review of Systems  Constitutional:  Positive for malaise/fatigue. Negative for chills, fever and weight loss.  HENT:  Negative for congestion, ear discharge and nosebleeds.   Eyes:  Negative for blurred vision.  Respiratory:  Negative for cough, hemoptysis, sputum production, shortness of breath and wheezing.   Cardiovascular:  Negative for chest pain, palpitations, orthopnea and claudication.  Gastrointestinal:  Negative for abdominal pain, blood in stool, constipation, diarrhea, heartburn, melena, nausea and vomiting.  Genitourinary:  Negative for dysuria, flank pain, frequency, hematuria and urgency.  Musculoskeletal:  Negative for back pain, joint pain and myalgias.  Skin:  Negative for rash.  Neurological:  Negative for dizziness, tingling, focal weakness, seizures, weakness and headaches.  Endo/Heme/Allergies:  Does not bruise/bleed easily.  Psychiatric/Behavioral:  Negative for depression  and suicidal ideas. The patient does not have insomnia.     Allergies  Allergen Reactions   Ace Inhibitors Anaphylaxis   Banana Anaphylaxis   Latex Itching   Pecan Pollen     Patient Active Problem List   Diagnosis Date Noted   Cough 03/06/2017     Past Medical History:  Diagnosis Date   Diabetes mellitus without complication (HCC)    Skin cancer    Sleep apnea      Past Surgical History:  Procedure Laterality Date   NASAL SINUS SURGERY      Social History   Socioeconomic History   Marital status: Married    Spouse name: Not on file   Number of children: Not on file   Years of education: Not on file   Highest education level: Not on file  Occupational History   Not on file  Tobacco Use   Smoking status: Never   Smokeless tobacco: Never  Vaping Use   Vaping status: Never Used  Substance and Sexual Activity   Alcohol use: No   Drug use: No   Sexual activity: Not on file  Other Topics Concern   Not on file  Social History Narrative   Not on file   Social Drivers of Health   Financial Resource Strain: Low Risk  (08/19/2023)   Received from [facility]   Overall Financial Resource Strain (CARDIA)    Difficulty of Paying Living Expenses: Not hard at all  Food Insecurity: No Food Insecurity (09/17/2023)   Hunger Vital Sign    Worried About Running Out of Food in the Last Year: Never true    Ran Out of Food in the Last Year: Never  true  Transportation Needs: No Transportation Needs (09/17/2023)   PRAPARE - Administrator, Civil Service (Medical): No    Lack of Transportation (Non-Medical): No  Physical Activity: Not on file  Stress: Not on file  Social Connections: Not on file  Intimate Partner Violence: Not At Risk (09/17/2023)   Humiliation, Afraid, Rape, and Kick questionnaire    Fear of Current or Ex-Partner: No    Emotionally Abused: No    Physically Abused: No    Sexually Abused: No     Family History  Problem  Relation Age of Onset   Diabetes Mother    Diabetes Father      Current Outpatient Medications:    albuterol  (VENTOLIN  HFA) 108 (90 Base) MCG/ACT inhaler, Inhale 1-2 puffs into the lungs every 6 (six) hours as needed for wheezing or shortness of breath., Disp: 18 g, Rfl: 0   aspirin  81 MG chewable tablet, Chew by mouth daily., Disp: , Rfl:    atorvastatin (LIPITOR) 10 MG tablet, Take 10 mg by mouth daily., Disp: , Rfl:    Blood Glucose Monitoring Suppl (ONETOUCH VERIO FLEX SYSTEM) w/Device KIT, See admin instructions., Disp: , Rfl:    cetirizine (ZYRTEC) 10 MG tablet, Take 10 mg by mouth daily., Disp: , Rfl:    fluticasone (FLONASE) 50 MCG/ACT nasal spray, Place 2 sprays into both nostrils daily., Disp: , Rfl:    ipratropium (ATROVENT ) 0.06 % nasal spray, Place 2 sprays into both nostrils 4 (four) times daily., Disp: 15 mL, Rfl: 12   Lancets (ONETOUCH DELICA PLUS LANCET33G) MISC, Apply 1 each topically daily., Disp: , Rfl:    Lido-PE-Glycerin -Petrolatum  (PREPARATION H RAPID RELIEF) 5-0.25-14.4-15 % CREA, Place 1 application rectally 3 (three) times daily as needed (hemorrhoidal pain)., Disp: 1 Tube, Rfl: 0   metFORMIN (GLUCOPHAGE) 500 MG tablet, Take 500 mg by mouth 2 (two) times daily with a meal., Disp: , Rfl:    montelukast (SINGULAIR) 10 MG tablet, Take 10 mg by mouth at bedtime., Disp: , Rfl:    MOUNJARO 2.5 MG/0.5ML Pen, SMARTSIG:0.5 Milliliter(s) SUB-Q Once a Week, Disp: , Rfl:    omeprazole (PRILOSEC) 20 MG capsule, Take 20 mg by mouth daily., Disp: , Rfl:    ONETOUCH VERIO test strip, 1 each daily., Disp: , Rfl:    Spacer/Aero-Holding Chambers (AEROCHAMBER MV) inhaler, Use as instructed, Disp: 1 each, Rfl: 2   triamcinolone (NASACORT) 55 MCG/ACT AERO nasal inhaler, Place 2 sprays into both nostrils once daily, Disp: , Rfl:    witch hazel-glycerin  (TUCKS) pad, Apply 1 application topically as needed for itching., Disp: 40 each, Rfl: 0   Budesonide  (PULMICORT  FLEXHALER) 90 MCG/ACT  inhaler, Inhale 1 puff into the lungs 2 (two) times daily. (Patient not taking: Reported on 09/17/2023), Disp: 1 each, Rfl: 0   famotidine  (PEPCID ) 20 MG tablet, Take 1 tablet (20 mg total) by mouth 2 (two) times daily. (Patient not taking: Reported on 09/17/2023), Disp: 60 tablet, Rfl: 0   gabapentin  (NEURONTIN ) 100 MG capsule, Take 1 capsule (100 mg total) by mouth at bedtime. (Patient not taking: Reported on 09/17/2023), Disp: 20 capsule, Rfl: 0   glipiZIDE (GLUCOTROL XL) 10 MG 24 hr tablet, Take 10 mg by mouth daily. (Patient not taking: Reported on 09/17/2023), Disp: , Rfl:    methocarbamol  (ROBAXIN ) 500 MG tablet, Take 1 tablet (500 mg total) by mouth 2 (two) times daily. (Patient not taking: Reported on 09/17/2023), Disp: 20 tablet, Rfl: 0   ondansetron  (ZOFRAN ) 4 MG tablet,  Take 1-2 tablets (4-8 mg total) by mouth every 6 (six) hours. (Patient not taking: Reported on 09/17/2023), Disp: 20 tablet, Rfl: 0   oseltamivir  (TAMIFLU ) 75 MG capsule, Take 1 capsule (75 mg total) by mouth every 12 (twelve) hours. (Patient not taking: Reported on 09/17/2023), Disp: 10 capsule, Rfl: 0   predniSONE  (STERAPRED UNI-PAK 21 TAB) 10 MG (21) TBPK tablet, Take by mouth daily. Take 6 tabs by mouth daily for 1, then 5 tabs for 1 day, then 4 tabs for 1 day, then 3 tabs for 1 day, then 2 tabs for 1 day, then 1 tab for 1 day. (Patient not taking: Reported on 09/17/2023), Disp: 21 tablet, Rfl: 0   Physical exam:  Vitals:   09/17/23 1428  BP: (!) 132/96  Pulse: 86  Resp: 16  Temp: 97.6 F (36.4 C)  TempSrc: Tympanic  SpO2: 95%  Weight: (!) 337 lb (152.9 kg)   Physical Exam Cardiovascular:     Rate and Rhythm: Normal rate and regular rhythm.     Heart sounds: Normal heart sounds.  Pulmonary:     Effort: Pulmonary effort is normal.     Breath sounds: Normal breath sounds.  Abdominal:     General: Bowel sounds are normal.     Palpations: Abdomen is soft.     Comments: No palpable hepatosplenomegaly  Skin:     General: Skin is warm and dry.  Neurological:     Mental Status: He is alert and oriented to person, place, and time.           Latest Ref Rng & Units 08/16/2022   11:17 PM  CMP  Glucose 70 - 99 mg/dL 877   BUN 6 - 20 mg/dL 17   Creatinine 9.38 - 1.24 mg/dL 8.94   Sodium 864 - 854 mmol/L 133   Potassium 3.5 - 5.1 mmol/L 4.1   Chloride 98 - 111 mmol/L 99   CO2 22 - 32 mmol/L 26   Calcium 8.9 - 10.3 mg/dL 8.8       Latest Ref Rng & Units 08/16/2022   11:17 PM  CBC  WBC 4.0 - 10.5 K/uL 10.4   Hemoglobin 13.0 - 17.0 g/dL 83.0   Hematocrit 60.9 - 52.0 % 49.3   Platelets 150 - 400 K/uL 294      Assessment and plan- Patient is a 58 y.o. male referred for polycythemia  Discussed differences between polycythemia vera and secondary polycythemia.  Suspect patient's polycythemia is secondary to underlying sleep apnea.  I am doing workup for polycythemia vera today including CBC, EPO levels, JAK2 with reflex to CALR and MPL mutation testing.  I am also checking urinalysis and serum testosterone  levels.  I will see him back in 2 to 3 weeks time for in person or virtual visit to discuss results of blood work   Thank you for this kind referral and the opportunity to participate in the care of this  Patient   Visit Diagnosis 1. Polycythemia     Dr. Annah Skene, MD, MPH [facility] [phone] 09/17/2023

## 2023-09-18 LAB — ERYTHROPOIETIN: Erythropoietin: 17.6 m[IU]/mL (ref 2.6–18.5)

## 2023-09-18 LAB — TESTOSTERONE: Testosterone: 287 ng/dL (ref 264–916)

## 2023-09-28 LAB — JAK2 V617F RFX CALR/MPL/E12-15

## 2023-09-28 LAB — CALR +MPL + E12-E15  (REFLEX)

## 2023-10-02 ENCOUNTER — Other Ambulatory Visit: Payer: Self-pay

## 2023-10-02 ENCOUNTER — Encounter: Payer: Self-pay | Admitting: Oncology

## 2023-10-02 ENCOUNTER — Inpatient Hospital Stay: Attending: Oncology | Admitting: Oncology

## 2023-10-02 DIAGNOSIS — D751 Secondary polycythemia: Secondary | ICD-10-CM | POA: Diagnosis not present

## 2023-10-02 NOTE — Progress Notes (Signed)
 Trouble sleeping because of back (stinal stenosis).  Lost 15 pounds intentionally.  Urinary frequency.  Chest pain last night; he sleeps on his side and feels like that's where a lot of it comes from.  Pain 3/10 chronic back pain.

## 2023-10-05 NOTE — Progress Notes (Signed)
 I connected with Alvin Ward on 10/05/23 at 10:30 AM EDT by video enabled telemedicine visit and verified that I am speaking with the correct person using two identifiers.   I discussed the limitations, risks, security and privacy concerns of performing an evaluation and management service by telemedicine and the availability of in-person appointments. I also discussed with the patient that there may be a patient responsible charge related to this service. The patient expressed understanding and agreed to proceed.  Other persons participating in the visit and their role in the encounter:  none  Patient's location:  home Provider's location:  home  Chief Complaint:  discuss results of bloodwork  History of present illness: patient is a 58 year old male with a past medical history Significant for type 2 diabetes, obstructive sleep apnea and referred for polycythemia.  CBC from 08/19/2023 showed white count of 10.7, H&H of 17.7/51.6 and platelet count of 272.  Looking back at his prior CBCs his hemoglobin has been mostly between 16-17 without a clear rising trend.  He is a non-smoker.  No prior history of thrombosis.  No history of testosterone  replacement therapy.  He has not been able to use his CPAP recently as he was unable to tolerate it   Results of blood work from 09/17/2023 were as follows: CBC showed H&H of 17.4/50.2.  JAK2 mutation testing was negative.  No evidence of CALR MPL or exon 12 mutation.  Serum EPO levels were normal at 17.6.  Interval history no acute issues since last visit.  Patient has chronic fatigue and is trying to be more compliant with the use of his CPAP.   Review of Systems  Constitutional:  Positive for malaise/fatigue. Negative for chills, fever and weight loss.  HENT:  Negative for congestion, ear discharge and nosebleeds.   Eyes:  Negative for blurred vision.  Respiratory:  Negative for cough, hemoptysis, sputum production, shortness of breath and wheezing.    Cardiovascular:  Negative for chest pain, palpitations, orthopnea and claudication.  Gastrointestinal:  Negative for abdominal pain, blood in stool, constipation, diarrhea, heartburn, melena, nausea and vomiting.  Genitourinary:  Negative for dysuria, flank pain, frequency, hematuria and urgency.  Musculoskeletal:  Negative for back pain, joint pain and myalgias.  Skin:  Negative for rash.  Neurological:  Negative for dizziness, tingling, focal weakness, seizures, weakness and headaches.  Endo/Heme/Allergies:  Does not bruise/bleed easily.  Psychiatric/Behavioral:  Negative for depression and suicidal ideas. The patient does not have insomnia.     Allergies  Allergen Reactions   Ace Inhibitors Anaphylaxis   Banana Anaphylaxis   Latex Itching   Pecan Pollen     Past Medical History:  Diagnosis Date   Diabetes mellitus without complication (HCC)    Skin cancer    Sleep apnea     Past Surgical History:  Procedure Laterality Date   NASAL SINUS SURGERY      Social History   Socioeconomic History   Marital status: Married    Spouse name: Not on file   Number of children: Not on file   Years of education: Not on file   Highest education level: Not on file  Occupational History   Not on file  Tobacco Use   Smoking status: Never   Smokeless tobacco: Never  Vaping Use   Vaping status: Never Used  Substance and Sexual Activity   Alcohol use: No   Drug use: No   Sexual activity: Not on file  Other Topics Concern   Not on file  Social History Narrative   Not on file   Social Drivers of Health   Financial Resource Strain: Low Risk  (08/19/2023)   Received from [facility]   Overall Financial Resource Strain (CARDIA)    Difficulty of Paying Living Expenses: Not hard at all  Food Insecurity: No Food Insecurity (09/17/2023)   Hunger Vital Sign    Worried About Running Out of Food in the Last Year: Never true    Ran Out of Food in the Last Year: Never  true  Transportation Needs: No Transportation Needs (09/17/2023)   PRAPARE - Administrator, Civil Service (Medical): No    Lack of Transportation (Non-Medical): No  Physical Activity: Not on file  Stress: Not on file  Social Connections: Not on file  Intimate Partner Violence: Not At Risk (09/17/2023)   Humiliation, Afraid, Rape, and Kick questionnaire    Fear of Current or Ex-Partner: No    Emotionally Abused: No    Physically Abused: No    Sexually Abused: No    Family History  Problem Relation Age of Onset   Diabetes Mother    Diabetes Father      Current Outpatient Medications:    albuterol  (VENTOLIN  HFA) 108 (90 Base) MCG/ACT inhaler, Inhale 1-2 puffs into the lungs every 6 (six) hours as needed for wheezing or shortness of breath., Disp: 18 g, Rfl: 0   aspirin  81 MG chewable tablet, Chew by mouth daily., Disp: , Rfl:    atorvastatin (LIPITOR) 10 MG tablet, Take 10 mg by mouth daily., Disp: , Rfl:    Blood Glucose Monitoring Suppl (ONETOUCH VERIO FLEX SYSTEM) w/Device KIT, See admin instructions., Disp: , Rfl:    cetirizine (ZYRTEC) 10 MG tablet, Take 10 mg by mouth daily., Disp: , Rfl:    ipratropium (ATROVENT ) 0.06 % nasal spray, Place 2 sprays into both nostrils 4 (four) times daily. (Patient taking differently: Place 2 sprays into both nostrils as needed.), Disp: 15 mL, Rfl: 12   Lancets (ONETOUCH DELICA PLUS LANCET33G) MISC, Apply 1 each topically daily., Disp: , Rfl:    Lido-PE-Glycerin -Petrolatum  (PREPARATION H RAPID RELIEF) 5-0.25-14.4-15 % CREA, Place 1 application rectally 3 (three) times daily as needed (hemorrhoidal pain)., Disp: 1 Tube, Rfl: 0   metFORMIN (GLUCOPHAGE) 500 MG tablet, Take 500 mg by mouth 2 (two) times daily with a meal., Disp: , Rfl:    montelukast (SINGULAIR) 10 MG tablet, Take 10 mg by mouth at bedtime., Disp: , Rfl:    MOUNJARO 2.5 MG/0.5ML Pen, SMARTSIG:0.5 Milliliter(s) SUB-Q Once a Week, Disp: , Rfl:    omeprazole (PRILOSEC) 20 MG  capsule, Take 20 mg by mouth daily., Disp: , Rfl:    ONETOUCH VERIO test strip, 1 each daily., Disp: , Rfl:    Spacer/Aero-Holding Chambers (AEROCHAMBER MV) inhaler, Use as instructed, Disp: 1 each, Rfl: 2   triamcinolone (NASACORT) 55 MCG/ACT AERO nasal inhaler, Place 2 sprays into both nostrils once daily, Disp: , Rfl:    witch hazel-glycerin  (TUCKS) pad, Apply 1 application topically as needed for itching., Disp: 40 each, Rfl: 0   Budesonide  (PULMICORT  FLEXHALER) 90 MCG/ACT inhaler, Inhale 1 puff into the lungs 2 (two) times daily. (Patient not taking: Reported on 10/02/2023), Disp: 1 each, Rfl: 0   famotidine  (PEPCID ) 20 MG tablet, Take 1 tablet (20 mg total) by mouth 2 (two) times daily. (Patient not taking: Reported on 10/02/2023), Disp: 60 tablet, Rfl: 0   fluticasone (FLONASE) 50 MCG/ACT nasal spray, Place 2 sprays  into both nostrils daily. (Patient not taking: Reported on 10/02/2023), Disp: , Rfl:    gabapentin  (NEURONTIN ) 100 MG capsule, Take 1 capsule (100 mg total) by mouth at bedtime. (Patient not taking: Reported on 10/02/2023), Disp: 20 capsule, Rfl: 0   glipiZIDE (GLUCOTROL XL) 10 MG 24 hr tablet, Take 10 mg by mouth daily. (Patient not taking: Reported on 10/02/2023), Disp: , Rfl:    methocarbamol  (ROBAXIN ) 500 MG tablet, Take 1 tablet (500 mg total) by mouth 2 (two) times daily. (Patient not taking: Reported on 10/02/2023), Disp: 20 tablet, Rfl: 0   ondansetron  (ZOFRAN ) 4 MG tablet, Take 1-2 tablets (4-8 mg total) by mouth every 6 (six) hours. (Patient not taking: Reported on 10/02/2023), Disp: 20 tablet, Rfl: 0   oseltamivir  (TAMIFLU ) 75 MG capsule, Take 1 capsule (75 mg total) by mouth every 12 (twelve) hours. (Patient not taking: Reported on 10/02/2023), Disp: 10 capsule, Rfl: 0   predniSONE  (STERAPRED UNI-PAK 21 TAB) 10 MG (21) TBPK tablet, Take by mouth daily. Take 6 tabs by mouth daily for 1, then 5 tabs for 1 day, then 4 tabs for 1 day, then 3 tabs for 1 day, then 2 tabs for 1 day, then 1 tab  for 1 day. (Patient not taking: Reported on 10/02/2023), Disp: 21 tablet, Rfl: 0  No results found.  No images are attached to the encounter.      Latest Ref Rng & Units 08/16/2022   11:17 PM  CMP  Glucose 70 - 99 mg/dL 877   BUN 6 - 20 mg/dL 17   Creatinine 9.38 - 1.24 mg/dL 8.94   Sodium 864 - 854 mmol/L 133   Potassium 3.5 - 5.1 mmol/L 4.1   Chloride 98 - 111 mmol/L 99   CO2 22 - 32 mmol/L 26   Calcium 8.9 - 10.3 mg/dL 8.8       Latest Ref Rng & Units 09/17/2023    3:16 PM  CBC  WBC 4.0 - 10.5 K/uL 8.7   Hemoglobin 13.0 - 17.0 g/dL 82.5   Hematocrit 60.9 - 52.0 % 50.2   Platelets 150 - 400 K/uL 251      Observation/objective: Appears in no acute distress over video visit today.  Breathing is nonlabored  Assessment and plan: Patient is a 58 year old male referred For polycythemia likely secondary to sleep apnea  Patient's hemoglobin has been at the upper limit of normal even dating back to 2021 when his hemoglobin was 17.3 and hematocrit 51.1.  It has been more or less in the same range since then without a clear rising trend.  Results of polycythemia vera workup including JAK2, CALR, MPL and EPO testing were unremarkable.  Patient therefore does not have polycythemia vera.  He is a non-smoker.  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  No indication for phlebotomy unless hematocrit greater than 55.  CBC with differential in 4 and 8 months and I will see him back in 8 months  Follow-up instructions:  I discussed the assessment and treatment plan with the patient. The patient was provided an opportunity to ask questions and all were answered. The patient agreed with the plan and demonstrated an understanding of the instructions.   The patient was advised to call back or seek an in-person evaluation if the symptoms worsen or if the condition fails to improve as anticipated.  I provided 12 minutes of face-to-face  video visit time during this encounter, and >  50% was spent counseling as documented under my assessment & plan.  Visit Diagnosis: 1. Polycythemia     Dr. Annah Skene, MD, MPH [facility] at [facility] Tel- [phone] 10/05/2023 8:25 AM

## 2024-02-02 ENCOUNTER — Inpatient Hospital Stay: Attending: Oncology

## 2024-02-02 DIAGNOSIS — D751 Secondary polycythemia: Secondary | ICD-10-CM | POA: Insufficient documentation

## 2024-02-02 LAB — CBC WITH DIFFERENTIAL (CANCER CENTER ONLY)
Abs Immature Granulocytes: 0.06 K/uL (ref 0.00–0.07)
Basophils Absolute: 0.1 K/uL (ref 0.0–0.1)
Basophils Relative: 1 %
Eosinophils Absolute: 0.2 K/uL (ref 0.0–0.5)
Eosinophils Relative: 2 %
HCT: 50.6 % (ref 39.0–52.0)
Hemoglobin: 17.6 g/dL — ABNORMAL HIGH (ref 13.0–17.0)
Immature Granulocytes: 1 %
Lymphocytes Relative: 21 %
Lymphs Abs: 2 K/uL (ref 0.7–4.0)
MCH: 30.9 pg (ref 26.0–34.0)
MCHC: 34.8 g/dL (ref 30.0–36.0)
MCV: 88.8 fL (ref 80.0–100.0)
Monocytes Absolute: 0.8 K/uL (ref 0.1–1.0)
Monocytes Relative: 9 %
Neutro Abs: 6.6 K/uL (ref 1.7–7.7)
Neutrophils Relative %: 66 %
Platelet Count: 262 K/uL (ref 150–400)
RBC: 5.7 MIL/uL (ref 4.22–5.81)
RDW: 13.2 % (ref 11.5–15.5)
WBC Count: 9.8 K/uL (ref 4.0–10.5)
nRBC: 0 % (ref 0.0–0.2)

## 2024-05-31 ENCOUNTER — Ambulatory Visit: Admitting: Oncology

## 2024-05-31 ENCOUNTER — Other Ambulatory Visit
